# Patient Record
Sex: Female | Born: 1955 | Race: Black or African American | Hispanic: No | Marital: Single | Smoking: Former smoker
Health system: Southern US, Community
[De-identification: ages and names within clinical notes are randomized; demographics above are authoritative.]

## PROBLEM LIST (undated history)

## (undated) DIAGNOSIS — E785 Hyperlipidemia, unspecified: Secondary | ICD-10-CM

## (undated) DIAGNOSIS — I10 Essential (primary) hypertension: Secondary | ICD-10-CM

## (undated) DIAGNOSIS — F329 Major depressive disorder, single episode, unspecified: Secondary | ICD-10-CM

## (undated) DIAGNOSIS — F32A Depression, unspecified: Secondary | ICD-10-CM

## (undated) DIAGNOSIS — F419 Anxiety disorder, unspecified: Secondary | ICD-10-CM

## (undated) DIAGNOSIS — E119 Type 2 diabetes mellitus without complications: Secondary | ICD-10-CM

## (undated) HISTORY — PX: ABDOMINAL HYSTERECTOMY: SHX81

---

## 2003-12-22 ENCOUNTER — Ambulatory Visit (HOSPITAL_COMMUNITY): Admission: RE | Admit: 2003-12-22 | Discharge: 2003-12-22 | Payer: Self-pay | Admitting: Family Medicine

## 2004-06-13 ENCOUNTER — Inpatient Hospital Stay (HOSPITAL_COMMUNITY): Admission: RE | Admit: 2004-06-13 | Discharge: 2004-06-15 | Payer: Self-pay | Admitting: Obstetrics & Gynecology

## 2004-08-20 ENCOUNTER — Emergency Department (HOSPITAL_COMMUNITY): Admission: EM | Admit: 2004-08-20 | Discharge: 2004-08-20 | Payer: Self-pay | Admitting: *Deleted

## 2015-09-28 ENCOUNTER — Emergency Department (HOSPITAL_COMMUNITY)
Admission: EM | Admit: 2015-09-28 | Discharge: 2015-09-28 | Disposition: A | Discharge status: Home / Self Care | Payer: BLUE CROSS/BLUE SHIELD | Attending: Emergency Medicine | Admitting: Emergency Medicine

## 2015-09-28 ENCOUNTER — Emergency Department (HOSPITAL_COMMUNITY): Payer: BLUE CROSS/BLUE SHIELD

## 2015-09-28 ENCOUNTER — Encounter (HOSPITAL_COMMUNITY): Payer: Self-pay | Admitting: Emergency Medicine

## 2015-09-28 DIAGNOSIS — Z79899 Other long term (current) drug therapy: Secondary | ICD-10-CM | POA: Insufficient documentation

## 2015-09-28 DIAGNOSIS — R42 Dizziness and giddiness: Secondary | ICD-10-CM | POA: Diagnosis not present

## 2015-09-28 DIAGNOSIS — R202 Paresthesia of skin: Secondary | ICD-10-CM | POA: Insufficient documentation

## 2015-09-28 DIAGNOSIS — Z5181 Encounter for therapeutic drug level monitoring: Secondary | ICD-10-CM | POA: Diagnosis not present

## 2015-09-28 DIAGNOSIS — I10 Essential (primary) hypertension: Secondary | ICD-10-CM | POA: Diagnosis not present

## 2015-09-28 DIAGNOSIS — F17210 Nicotine dependence, cigarettes, uncomplicated: Secondary | ICD-10-CM | POA: Diagnosis not present

## 2015-09-28 DIAGNOSIS — R200 Anesthesia of skin: Secondary | ICD-10-CM | POA: Diagnosis present

## 2015-09-28 HISTORY — DX: Essential (primary) hypertension: I10

## 2015-09-28 HISTORY — DX: Hyperlipidemia, unspecified: E78.5

## 2015-09-28 LAB — COMPREHENSIVE METABOLIC PANEL
ALT: 32 U/L (ref 14–54)
AST: 29 U/L (ref 15–41)
Albumin: 4.3 g/dL (ref 3.5–5.0)
Alkaline Phosphatase: 79 U/L (ref 38–126)
Anion gap: 6 (ref 5–15)
BUN: 32 mg/dL — ABNORMAL HIGH (ref 6–20)
CO2: 24 mmol/L (ref 22–32)
Calcium: 9 mg/dL (ref 8.9–10.3)
Chloride: 102 mmol/L (ref 101–111)
Creatinine, Ser: 0.96 mg/dL (ref 0.44–1.00)
GFR calc Af Amer: >60 mL/min (ref >60)
GFR calc non Af Amer: >60 mL/min (ref >60)
Glucose, Bld: 100 mg/dL — ABNORMAL HIGH (ref 65–99)
Potassium: 3.4 mmol/L — ABNORMAL LOW (ref 3.5–5.1)
Sodium: 132 mmol/L — ABNORMAL LOW (ref 135–145)
Total Bilirubin: 0.3 mg/dL (ref 0.3–1.2)
Total Protein: 8.5 g/dL — ABNORMAL HIGH (ref 6.5–8.1)

## 2015-09-28 LAB — URINALYSIS, ROUTINE W REFLEX MICROSCOPIC
Bilirubin Urine: NEGATIVE
Glucose, UA: NEGATIVE mg/dL
Ketones, ur: NEGATIVE mg/dL
Leukocytes, UA: NEGATIVE
Nitrite: NEGATIVE
Protein, ur: NEGATIVE mg/dL
Specific Gravity, Urine: 1.025 (ref 1.005–1.030)
pH: 5.5 (ref 5.0–8.0)

## 2015-09-28 LAB — DIFFERENTIAL
Basophils Absolute: 0 10*3/uL (ref 0.0–0.1)
Basophils Relative: 1 %
Eosinophils Absolute: 0.1 10*3/uL (ref 0.0–0.7)
Eosinophils Relative: 2 %
Lymphocytes Relative: 38 %
Lymphs Abs: 2.2 10*3/uL (ref 0.7–4.0)
Monocytes Absolute: 0.5 10*3/uL (ref 0.1–1.0)
Monocytes Relative: 9 %
Neutro Abs: 2.9 10*3/uL (ref 1.7–7.7)
Neutrophils Relative %: 50 %

## 2015-09-28 LAB — TROPONIN I: Troponin I: <0.03 ng/mL (ref <0.03)

## 2015-09-28 LAB — CBC
HCT: 39.9 % (ref 36.0–46.0)
Hemoglobin: 12.8 g/dL (ref 12.0–15.0)
MCH: 26.8 pg (ref 26.0–34.0)
MCHC: 32.1 g/dL (ref 30.0–36.0)
MCV: 83.6 fL (ref 78.0–100.0)
Platelets: 243 10*3/uL (ref 150–400)
RBC: 4.77 MIL/uL (ref 3.87–5.11)
RDW: 14.6 % (ref 11.5–15.5)
WBC: 5.7 10*3/uL (ref 4.0–10.5)

## 2015-09-28 LAB — URINE MICROSCOPIC-ADD ON

## 2015-09-28 LAB — RAPID URINE DRUG SCREEN, HOSP PERFORMED
Amphetamines: NOT DETECTED
Barbiturates: NOT DETECTED
Benzodiazepines: NOT DETECTED
Cocaine: NOT DETECTED
Opiates: NOT DETECTED
Tetrahydrocannabinol: NOT DETECTED

## 2015-09-28 LAB — PROTIME-INR
INR: 0.96
Prothrombin Time: 12.8 seconds (ref 11.4–15.2)

## 2015-09-28 LAB — APTT: aPTT: 40 seconds — ABNORMAL HIGH (ref 24–36)

## 2015-09-28 LAB — ETHANOL: Alcohol, Ethyl (B): <5 mg/dL (ref <5)

## 2015-09-28 MED ORDER — POTASSIUM CHLORIDE CRYS ER 20 MEQ PO TBCR
20.0000 meq | EXTENDED_RELEASE_TABLET | Freq: Once | ORAL | Status: AC
  Administered 2015-09-28: 20 meq via ORAL
  Filled 2015-09-28: qty 1

## 2015-09-28 MED ORDER — LORAZEPAM 2 MG/ML IJ SOLN
INTRAMUSCULAR | Status: AC
  Filled 2015-09-28: qty 1

## 2015-09-28 MED ORDER — LORAZEPAM 2 MG/ML IJ SOLN
1.0000 mg | Freq: Once | INTRAMUSCULAR | Status: AC
Start: 2015-09-28 — End: 2015-09-28
  Administered 2015-09-28: 1 mg via INTRAVENOUS

## 2015-09-28 NOTE — ED Notes (Signed)
Patient verbalizes understanding of discharge instructions, home care and follow up care. Patient out of department at this time. 

## 2015-09-28 NOTE — ED Triage Notes (Signed)
Pt reports on-going dizziness, LT hand numbness for an unknown amount of time, but states it has been over a week. Pt poor historian. Pt noted to have a LT sided facial droop. When asked if this was normal, pt stated that "I don't know, I think so." AOx4. No slurred speech. Grips equal and strong.

## 2015-09-28 NOTE — ED Notes (Signed)
Patient ambulatory to and from restroom.

## 2015-09-28 NOTE — ED Notes (Signed)
MRI made doctor aware that pt had increased anxiousness when entering MRI. Pt given ativan.

## 2015-09-28 NOTE — ED Notes (Signed)
Pt being transported to MRI.

## 2015-09-28 NOTE — Discharge Instructions (Signed)
Take your usual prescriptions as previously directed.  Call your regular medical doctor and the Neurologist tomorrow to schedule a follow up appointment within the next 3 days.  Return to the Emergency Department immediately sooner if worsening.

## 2015-09-28 NOTE — ED Provider Notes (Signed)
[facility] DEPT Provider Note   CSN: [id number] Arrival date & time: 09/28/15  1800  First Provider Contact:  None       History   Chief Complaint Chief Complaint  Patient presents with  . Dizziness  . Numbness    HPI TAIA BACH is a 60 y.o. female.   Dizziness  Pt was seen at 1830. Per pt, c/o gradual onset and persistence of constant left hand "numbness" for "at least over a week or two." Cannot explain further where in her left hand the numbness is located. Pt also c/o "dizziness" for the same amount of time. Pt unclear regarding what she means by "dizziness," other than "when I sit down I'm fine then I stand up and I get dizzy." Denies CP/palpitations, no SOB/cough, no abd pain, no N/V/D, no focal motor weakness, no neck or back pain, no headache, no visual changes, no slurred speech, no dysphagia, no ataxia.     Past Medical History:  Diagnosis Date  . Hyperlipemia   . Hypertension     There are no active problems to display for this patient.   Past Surgical History:  Procedure Laterality Date  . ABDOMINAL HYSTERECTOMY        Home Medications    Prior to Admission medications   Not on File    Family History   Social History Social History  Substance Use Topics  . Smoking status: Current Every Day Smoker    Packs/day: 1.00    Types: Cigarettes  . Smokeless tobacco: Never Used  . Alcohol use No     Allergies   Review of patient's allergies indicates no known allergies.   Review of Systems Review of Systems  Neurological: Positive for dizziness.  ROS: Statement: All systems negative except as marked or noted in the HPI; Constitutional: Negative for fever and chills. ; ; Eyes: Negative for eye pain, redness and discharge. ; ; ENMT: Negative for ear pain, hoarseness, nasal congestion, sinus pressure and sore throat. ; ; Cardiovascular: Negative for chest pain, palpitations, diaphoresis, dyspnea and peripheral edema. ; ; Respiratory:  Negative for cough, wheezing and stridor. ; ; Gastrointestinal: Negative for nausea, vomiting, diarrhea, abdominal pain, blood in stool, hematemesis, jaundice and rectal bleeding. . ; ; Genitourinary: Negative for dysuria, flank pain and hematuria. ; ; Musculoskeletal: Negative for back pain and neck pain. Negative for swelling and trauma.; ; Skin: Negative for pruritus, rash, abrasions, blisters, bruising and skin lesion.; ; Neuro: +"numbness," "dizziness." Negative for headache, lightheadedness and neck stiffness. Negative for weakness, altered level of consciousness, altered mental status, extremity weakness, involuntary movement, seizure and syncope.      Physical Exam Updated Vital Signs BP 137/85 (BP Location: Left Arm)   Pulse 77   Temp 97.8 F (36.6 C) (Oral)   Resp 14   Ht 5\' 1"  (1.549 m)   Wt 139 lb (63 kg)   SpO2 100%   BMI 26.26 kg/m     20:32 Orthostatic Vital Signs TD  Orthostatic Lying   BP- Lying: 133/82  Pulse- Lying: 72      Orthostatic Sitting  BP- Sitting: 135/87  Pulse- Sitting: 75      Orthostatic Standing at 0 minutes  BP- Standing at 0 minutes:  138/93  Pulse- Standing at 0 minutes: 84    Physical Exam 1835: Physical examination:  Nursing notes reviewed; Vital signs and O2 SAT reviewed;  Constitutional: Well developed, Well nourished, Well hydrated, In no acute distress; Head:  Normocephalic, atraumatic;  Eyes: EOMI, PERRL, No scleral icterus; ENMT: Mouth and pharynx normal, Mucous membranes moist; Neck: Supple, Full range of motion, No lymphadenopathy; Cardiovascular: Regular rate and rhythm, No gallop; Respiratory: Breath sounds clear & equal bilaterally, No wheezes.  Speaking full sentences with ease, Normal respiratory effort/excursion; Chest: Nontender, Movement normal; Abdomen: Soft, Nontender, Nondistended, Normal bowel sounds; Genitourinary: No CVA tenderness; Extremities: Pulses normal, No tenderness, No edema, No calf edema or asymmetry. Left  shoulder w/FROM.  NT to palp entire joint, AC joint, clavicle NT, scapula NT, proximal humerus NT, biceps tendon NT over bicipital groove.  Motor strength at shoulder normal.  Sensation intact over deltoid region, distal NMS intact with hand on left side having intact and equal sensation and strength in the distribution of the median, radial, and ulnar nerve function compared to opposite side.  Strong radial pulse.  +FROM left elbow with intact motor strength biceps and triceps muscles to resistance.;; Spine:  No midline CS, TS, LS tenderness.;; Neuro: AA&Ox3, vague historian. Major CN grossly intact. Speech clear.  +left lower face drawn to the side.  No nystagmus. Grips equal. Strength 5/5 equal bilat UE's and LE's.  DTR 2/4 equal bilat UE's and LE's.  No gross sensory deficits.  Normal cerebellar testing bilat UE's (finger-nose) and LE's (heel-shin)..; Skin: Color normal, Warm, Dry.   ED Treatments / Results  Labs (all labs ordered are listed, but only abnormal results are displayed)   EKG  EKG Interpretation  Date/Time:  Thursday September 28 2015 18:10:25 EDT Ventricular Rate:  76 PR Interval:    QRS Duration: 84 QT Interval:  393 QTC Calculation: 442 R Axis:   21 Text Interpretation:  Sinus rhythm Abnormal R-wave progression, early transition Baseline wander No old tracing to compare Confirmed by [facility]  MD, Nunzio Cory [phone]) on 09/28/2015 7:18:21 PM       Radiology   Procedures Procedures (including critical care time)  Medications Ordered in ED Medications  LORazepam (ATIVAN) injection 1 mg (1 mg Intravenous Given 09/28/15 1854)     Initial Impression / Assessment and Plan / ED Course  I have reviewed the triage vital signs and the nursing notes.  Pertinent labs & imaging results that were available during my care of the patient were reviewed by me and considered in my medical decision making (see chart for details).  MDM Reviewed: previous chart, nursing note and  vitals Interpretation: labs, ECG and MRI   Results for orders placed or performed during the hospital encounter of 09/28/15  Ethanol  Result Value Ref Range   Alcohol, Ethyl (B) <5 <5 mg/dL  Protime-INR  Result Value Ref Range   Prothrombin Time 12.8 11.4 - 15.2 seconds   INR 0.96   APTT  Result Value Ref Range   aPTT 40 (H) 24 - 36 seconds  CBC  Result Value Ref Range   WBC 5.7 4.0 - 10.5 K/uL   RBC 4.77 3.87 - 5.11 MIL/uL   Hemoglobin 12.8 12.0 - 15.0 g/dL   HCT 39.9 36.0 - 46.0 %   MCV 83.6 78.0 - 100.0 fL   MCH 26.8 26.0 - 34.0 pg   MCHC 32.1 30.0 - 36.0 g/dL   RDW 14.6 11.5 - 15.5 %   Platelets 243 150 - 400 K/uL  Differential  Result Value Ref Range   Neutrophils Relative % 50 %   Neutro Abs 2.9 1.7 - 7.7 K/uL   Lymphocytes Relative 38 %   Lymphs Abs 2.2 0.7 - 4.0 K/uL   Monocytes  Relative 9 %   Monocytes Absolute 0.5 0.1 - 1.0 K/uL   Eosinophils Relative 2 %   Eosinophils Absolute 0.1 0.0 - 0.7 K/uL   Basophils Relative 1 %   Basophils Absolute 0.0 0.0 - 0.1 K/uL  Comprehensive metabolic panel  Result Value Ref Range   Sodium 132 (L) 135 - 145 mmol/L   Potassium 3.4 (L) 3.5 - 5.1 mmol/L   Chloride 102 101 - 111 mmol/L   CO2 24 22 - 32 mmol/L   Glucose, Bld 100 (H) 65 - 99 mg/dL   BUN 32 (H) 6 - 20 mg/dL   Creatinine, Ser 0.96 0.44 - 1.00 mg/dL   Calcium 9.0 8.9 - 10.3 mg/dL   Total Protein 8.5 (H) 6.5 - 8.1 g/dL   Albumin 4.3 3.5 - 5.0 g/dL   AST 29 15 - 41 U/L   ALT 32 14 - 54 U/L   Alkaline Phosphatase 79 38 - 126 U/L   Total Bilirubin 0.3 0.3 - 1.2 mg/dL   GFR calc non Af Amer >60 >60 mL/min   GFR calc Af Amer >60 >60 mL/min   Anion gap 6 5 - 15  Urine rapid drug screen (hosp performed)not at [facility]  Result Value Ref Range   Opiates NONE DETECTED NONE DETECTED   Cocaine NONE DETECTED NONE DETECTED   Benzodiazepines NONE DETECTED NONE DETECTED   Amphetamines NONE DETECTED NONE DETECTED   Tetrahydrocannabinol NONE DETECTED NONE DETECTED    Barbiturates NONE DETECTED NONE DETECTED  Urinalysis, Routine w reflex microscopic (not at [facility])  Result Value Ref Range   Color, Urine YELLOW YELLOW   APPearance CLEAR CLEAR   Specific Gravity, Urine 1.025 1.005 - 1.030   pH 5.5 5.0 - 8.0   Glucose, UA NEGATIVE NEGATIVE mg/dL   Hgb urine dipstick SMALL (A) NEGATIVE   Bilirubin Urine NEGATIVE NEGATIVE   Ketones, ur NEGATIVE NEGATIVE mg/dL   Protein, ur NEGATIVE NEGATIVE mg/dL   Nitrite NEGATIVE NEGATIVE   Leukocytes, UA NEGATIVE NEGATIVE  Troponin I  Result Value Ref Range   Troponin I <0.03 <0.03 ng/mL  Urine microscopic-add on  Result Value Ref Range   Squamous Epithelial / LPF 0-5 (A) NONE SEEN   WBC, UA 0-5 0 - 5 WBC/hpf   RBC / HPF 0-5 0 - 5 RBC/hpf   Bacteria, UA RARE (A) NONE SEEN   Dg Chest 2 View Result Date: 09/28/2015 CLINICAL DATA:  Ongoing dizziness and left arm numbness. EXAM: CHEST  2 VIEW COMPARISON:  None. FINDINGS: Cardiomediastinal silhouette is normal. Mediastinal contours appear intact. There is no evidence of focal airspace consolidation, pleural effusion or pneumothorax. Osseous structures are without acute abnormality. 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 Soft tissues are grossly normal. IMPRESSION: No active cardiopulmonary disease. Electronically Signed   By: Fidela Salisbury M.D.   On: 09/28/2015 20:28   Ct Cervical Spine Wo Contrast Result Date: 09/28/2015 CLINICAL DATA:  Left hand numbness an ongoing dizziness. EXAM: CT CERVICAL SPINE WITHOUT CONTRAST TECHNIQUE: Multidetector CT imaging of the cervical spine was performed without intravenous contrast. Multiplanar CT image reconstructions were also generated. COMPARISON:  None. FINDINGS: There is normal alignment of the cervical spine. There is no evidence for acute fracture or dislocation. There are multilevel osteoarthritic changes of the cervical spine with disc space narrowing, endplate sclerosis and osteophyte  formation. Mild to moderate posterior facet arthropathy is also noted. There is a resultant mild bony neural foramina narrowing of left C3-C4, bilateral  C4-C5, left C5-C6 and left C6-C7. Prevertebral soft tissues have a normal appearance. Lung apices have a normal appearance. IMPRESSION: No evidence of fracture or alignment abnormality of the cervical spine. Multilevel osteoarthritic changes of the cervical spine causing multilevel predominantly left-sided bony neural foramina narrowing. Electronically Signed   By: Fidela Salisbury M.D.   On: 09/28/2015 20:41   Mr Brain Wo Contrast (neuro Protocol) Result Date: 09/28/2015 CLINICAL DATA:  60 year old hypertensive female with hyperlipidemia with altered mental status and left-sided weakness for the past day. Initial encounter. EXAM: MRI HEAD WITHOUT CONTRAST TECHNIQUE: Multiplanar, multiecho pulse sequences of the brain and surrounding structures were obtained without intravenous contrast. COMPARISON:  None. FINDINGS: Exam is slightly motion degraded. No acute infarct or intracranial hemorrhage. Moderate to marked white matter changes which in the present clinical setting is most consistent with result of chronic microvascular disease. Other causes of white matter changes including that secondary to demyelinating process, inflammatory process or vasculitis are felt to be secondary less likely considerations. Mild atrophy without hydrocephalus. No intracranial mass lesion noted on this unenhanced exam. Major intracranial vascular structures are patent. No acute orbital abnormality. Cervical medullary junction within normal limits. Shallow sella with small pituitary gland probably an incidental finding. IMPRESSION: Exam is slightly motion degraded. No acute infarct or intracranial hemorrhage. Moderate to marked white matter changes which in the present clinical setting is most consistent with result of chronic microvascular disease. Other causes felt less likely.  No intracranial mass lesion noted on this unenhanced exam. Electronically Signed   By: Genia Del M.D.   On: 09/28/2015 19:37     1835:  Pt is very vague historian. Cannot further explain her symptoms nor when they began, only can state "they've been there a while." Pt asked regarding the left lower side of her face drawing and she said "I don't know." No other objective sensory or motor deficits on exam.  Family members in the exam room also unable to contribute further to HPI. Workup ordered. Will continue to monitor.   1855:  Pt given ativan before MRI for c/o anxiousness.   2120:  Pt not orthostatic on VS. Pt has ambulated with steady gait. Pt has tol PO well without N/V. Potassium repleted PO. Workup otherwise reassuring. No clear indication for admission at this time. Pt states she wants to go home now. Dx and testing d/w pt and family.  Questions answered.  Verb understanding, agreeable to d/c home with outpt f/u.     Final Clinical Impressions(s) / ED Diagnoses   Final diagnoses:  None    New Prescriptions New Prescriptions   No medications on file      Francine Graven, DO 10/01/15 1417

## 2015-09-30 ENCOUNTER — Encounter (HOSPITAL_COMMUNITY): Payer: Self-pay | Admitting: *Deleted

## 2015-09-30 ENCOUNTER — Emergency Department (HOSPITAL_COMMUNITY)
Admission: EM | Admit: 2015-09-30 | Discharge: 2015-09-30 | Disposition: A | Discharge status: Home / Self Care | Payer: BLUE CROSS/BLUE SHIELD | Attending: Emergency Medicine | Admitting: Emergency Medicine

## 2015-09-30 DIAGNOSIS — I10 Essential (primary) hypertension: Secondary | ICD-10-CM | POA: Insufficient documentation

## 2015-09-30 DIAGNOSIS — F17210 Nicotine dependence, cigarettes, uncomplicated: Secondary | ICD-10-CM | POA: Insufficient documentation

## 2015-09-30 DIAGNOSIS — Z79899 Other long term (current) drug therapy: Secondary | ICD-10-CM | POA: Insufficient documentation

## 2015-09-30 DIAGNOSIS — R42 Dizziness and giddiness: Secondary | ICD-10-CM | POA: Diagnosis present

## 2015-09-30 DIAGNOSIS — R51 Headache: Secondary | ICD-10-CM | POA: Insufficient documentation

## 2015-09-30 DIAGNOSIS — R519 Headache, unspecified: Secondary | ICD-10-CM

## 2015-09-30 MED ORDER — METOCLOPRAMIDE HCL 5 MG/ML IJ SOLN
10.0000 mg | Freq: Once | INTRAMUSCULAR | Status: AC
  Administered 2015-09-30: 10 mg via INTRAMUSCULAR
  Filled 2015-09-30: qty 2

## 2015-09-30 MED ORDER — KETOROLAC TROMETHAMINE 60 MG/2ML IM SOLN
60.0000 mg | Freq: Once | INTRAMUSCULAR | Status: AC
  Administered 2015-09-30: 60 mg via INTRAMUSCULAR
  Filled 2015-09-30: qty 2

## 2015-09-30 MED ORDER — IBUPROFEN 800 MG PO TABS: 800.0000 mg | ORAL_TABLET | Freq: Three times a day (TID) | ORAL | 0 refills | Status: DC

## 2015-09-30 MED ORDER — DIPHENHYDRAMINE HCL 25 MG PO CAPS
25.0000 mg | ORAL_CAPSULE | Freq: Once | ORAL | Status: AC
  Administered 2015-09-30: 25 mg via ORAL
  Filled 2015-09-30: qty 1

## 2015-09-30 MED ORDER — BUTALBITAL-APAP-CAFFEINE 50-325-40 MG PO TABS: 1.0000 | ORAL_TABLET | Freq: Four times a day (QID) | ORAL | 0 refills | Status: DC | PRN

## 2015-09-30 NOTE — ED Triage Notes (Signed)
Pt c/o intermittent dizziness and headache that started several days ago, was seen in er on 09/28/2015 for same, pt reports that she is not any better,

## 2015-09-30 NOTE — ED Provider Notes (Addendum)
[facility] DEPT Provider Note   CSN: [id number] Arrival date & time: 09/30/15  [id number]  First Provider Contact:  First MD Initiated Contact with Patient 09/30/15 1924        History   Chief Complaint Chief Complaint  Patient presents with  . Dizziness    HPI Teresa Sweeney is a 60 y.o. female.  HPI   The patient is a 60 year old female, she reports a history of hypertension and hyperlipidemia, she has had a vague history over the last couple of weeks of some tingling in the left hand associated with headaches, the headaches come and go, she reports that they are frontal, throbbing, intermittent, she has no photophobia vomiting fevers or stiff neck. Today the headaches have been particular severe, they have gone away several times and she was totally pain-free today as well. She does not identify any inciting or alleviating factors, she has taken [facility] powders prior to arrival and states that they do not help  Past Medical History:  Diagnosis Date  . Hyperlipemia   . Hypertension     There are no active problems to display for this patient.   Past Surgical History:  Procedure Laterality Date  . ABDOMINAL HYSTERECTOMY      OB History    No data available       Home Medications    Prior to Admission medications   Medication Sig Start Date End Date Taking? Authorizing Provider  pravastatin (PRAVACHOL) 20 MG tablet Take 20 mg by mouth at bedtime. 09/22/15  Yes Historical Provider, MD  triamterene-hydrochlorothiazide (DYAZIDE) 37.5-25 MG capsule Take 1 capsule by mouth daily. 09/22/15  Yes Historical Provider, MD  butalbital-acetaminophen-caffeine (FIORICET) 50-325-40 MG tablet Take 1-2 tablets by mouth every 6 (six) hours as needed for headache. 09/30/15 09/29/16  Noemi Chapel, MD  ibuprofen (ADVIL,MOTRIN) 800 MG tablet Take 1 tablet (800 mg total) by mouth 3 (three) times daily. 09/30/15   Noemi Chapel, MD    Family History No family history on file.  Social  History Social History  Substance Use Topics  . Smoking status: Current Every Day Smoker    Packs/day: 1.00    Types: Cigarettes  . Smokeless tobacco: Never Used  . Alcohol use No     Allergies   Review of patient's allergies indicates no known allergies.   Review of Systems Review of Systems  All other systems reviewed and are negative.    Physical Exam Updated Vital Signs BP 153/92 (BP Location: Right Arm)   Pulse 85   Temp 98.1 F (36.7 C) (Oral)   Resp 18   Ht 5\' 1"  (1.549 m)   Wt 138 lb (62.6 kg)   SpO2 100%   BMI 26.07 kg/m   Physical Exam  Constitutional: She appears well-developed and well-nourished. No distress.  HENT:  Head: Normocephalic and atraumatic.  Mouth/Throat: Oropharynx is clear and moist. No oropharyngeal exudate.  Clear tympanic membranes, clear external auditory canals  Eyes: Conjunctivae and EOM are normal. Pupils are equal, round, and reactive to light. Right eye exhibits no discharge. Left eye exhibits no discharge. No scleral icterus.  Neck: Normal range of motion. Neck supple. No JVD present. No thyromegaly present.  Cardiovascular: Normal rate, regular rhythm, normal heart sounds and intact distal pulses.  Exam reveals no gallop and no friction rub.   No murmur heard. Pulmonary/Chest: Effort normal and breath sounds normal. No respiratory distress. She has no wheezes. She has no rales.  Abdominal: Soft. Bowel sounds are normal. She  exhibits no distension and no mass. There is no tenderness.  Musculoskeletal: Normal range of motion. She exhibits no edema or tenderness.  Lymphadenopathy:    She has no cervical adenopathy.  Neurological: She is alert. Coordination normal.  Neurologic exam:  Speech clear, pupils equal round reactive to light, extraocular movements intact  Normal peripheral visual fields Cranial nerves III through XII normal including no facial droop Follows commands, moves all extremities x4, normal strength to bilateral  upper and lower extremities at all major muscle groups including grip Sensation normal to light touch and pinprick Coordination intact, no limb ataxia, finger-nose-finger normal Rapid alternating movements normal No pronator drift Gait normal  Skin: Skin is warm and dry. No rash noted. No erythema.  Psychiatric: She has a normal mood and affect. Her behavior is normal.  Nursing note and vitals reviewed.    ED Treatments / Results  Labs (all labs ordered are listed, but only abnormal results are displayed) Labs Reviewed - No data to display  EKG  EKG Interpretation None       Radiology No results found.  Procedures Procedures (including critical care time)  Medications Ordered in ED Medications  ketorolac (TORADOL) injection 60 mg (60 mg Intramuscular Given 09/30/15 1948)  metoCLOPramide (REGLAN) injection 10 mg (10 mg Intramuscular Given 09/30/15 1950)  diphenhydrAMINE (BENADRYL) capsule 25 mg (25 mg Oral Given 09/30/15 1947)     Initial Impression / Assessment and Plan / ED Course  I have reviewed the triage vital signs and the nursing notes.  Pertinent labs & imaging results that were available during my care of the patient were reviewed by me and considered in my medical decision making (see chart for details).  Clinical Course  Comment By Time  Improved to pain free with IM meds - feeling good and amenable to d/c - the pt's brother states that she stated that if she had a gun she would kill herself b/c of the pain.  The pt states that she would not do this and was only saying that metaphorically - she has no SI and no depression. Noemi Chapel, MD 08/05 2106   Over the last several days the patient has had a significant and very thorough workup for headaches and neurologic symptoms including an MRI of the brain and a CT scan of the neck, there was no acute findings, her lab work was diffusely normal and she has no other complaints other than headache which is now  resolved  Final Clinical Impressions(s) / ED Diagnoses   Final diagnoses:  Nonintractable headache, unspecified chronicity pattern, unspecified headache type    New Prescriptions New Prescriptions   BUTALBITAL-ACETAMINOPHEN-CAFFEINE (FIORICET) 50-325-40 MG TABLET    Take 1-2 tablets by mouth every 6 (six) hours as needed for headache.   IBUPROFEN (ADVIL,MOTRIN) 800 MG TABLET    Take 1 tablet (800 mg total) by mouth 3 (three) times daily.     Noemi Chapel, MD 09/30/15 [id number]    Noemi Chapel, MD 09/30/15 2109

## 2015-09-30 NOTE — Discharge Instructions (Signed)

## 2015-12-08 ENCOUNTER — Emergency Department (HOSPITAL_COMMUNITY)
Admission: EM | Admit: 2015-12-08 | Discharge: 2015-12-08 | Disposition: A | Discharge status: Home / Self Care | Payer: BLUE CROSS/BLUE SHIELD | Attending: Emergency Medicine | Admitting: Emergency Medicine

## 2015-12-08 ENCOUNTER — Encounter (HOSPITAL_COMMUNITY): Payer: Self-pay | Admitting: Emergency Medicine

## 2015-12-08 ENCOUNTER — Other Ambulatory Visit: Payer: Self-pay

## 2015-12-08 ENCOUNTER — Emergency Department (HOSPITAL_COMMUNITY): Payer: BLUE CROSS/BLUE SHIELD

## 2015-12-08 DIAGNOSIS — R0789 Other chest pain: Secondary | ICD-10-CM | POA: Insufficient documentation

## 2015-12-08 DIAGNOSIS — Z87891 Personal history of nicotine dependence: Secondary | ICD-10-CM | POA: Diagnosis not present

## 2015-12-08 DIAGNOSIS — Z791 Long term (current) use of non-steroidal anti-inflammatories (NSAID): Secondary | ICD-10-CM | POA: Diagnosis not present

## 2015-12-08 DIAGNOSIS — I10 Essential (primary) hypertension: Secondary | ICD-10-CM | POA: Insufficient documentation

## 2015-12-08 DIAGNOSIS — R079 Chest pain, unspecified: Secondary | ICD-10-CM

## 2015-12-08 LAB — BASIC METABOLIC PANEL
Anion gap: 7 (ref 5–15)
BUN: 33 mg/dL — AB (ref 6–20)
CHLORIDE: 103 mmol/L (ref 101–111)
CO2: 28 mmol/L (ref 22–32)
CREATININE: 1.44 mg/dL — AB (ref 0.44–1.00)
Calcium: 9.1 mg/dL (ref 8.9–10.3)
GFR calc Af Amer: 44 mL/min — ABNORMAL LOW (ref >60)
GFR calc non Af Amer: 38 mL/min — ABNORMAL LOW (ref >60)
GLUCOSE: 96 mg/dL (ref 65–99)
POTASSIUM: 3.6 mmol/L (ref 3.5–5.1)
Sodium: 138 mmol/L (ref 135–145)

## 2015-12-08 LAB — CBC
HEMATOCRIT: 39.2 % (ref 36.0–46.0)
Hemoglobin: 12.5 g/dL (ref 12.0–15.0)
MCH: 27.4 pg (ref 26.0–34.0)
MCHC: 31.9 g/dL (ref 30.0–36.0)
MCV: 85.8 fL (ref 78.0–100.0)
PLATELETS: 226 10*3/uL (ref 150–400)
RBC: 4.57 MIL/uL (ref 3.87–5.11)
RDW: 14.6 % (ref 11.5–15.5)
WBC: 4.4 10*3/uL (ref 4.0–10.5)

## 2015-12-08 LAB — I-STAT TROPONIN, ED: Troponin i, poc: 0 ng/mL (ref 0.00–0.08)

## 2015-12-08 NOTE — ED Provider Notes (Signed)
[facility] DEPT Provider Note   CSN: [id number] Arrival date & time: 12/08/15  1654     History   Chief Complaint Chief Complaint  Patient presents with  . Chest Pain    HPI Teresa Sweeney is a 60 y.o. female. Here for evaluation of chest discomfort present for several days, evaluated yesterday by her PCP, and 2 weeks ago at another hospital. The pain is worse with deep breathing, movement and coughing. No productive cough. No fever, chills, nausea, vomiting, weakness or dizziness. Her PCP prescribed "a muscle relaxer" yesterday, which she has started take but not had improvement, yet. She denies headache, neck pain or back pain. Her job requires heavy lifting. There are no other known modifying factors.  HPI  Past Medical History:  Diagnosis Date  . Hyperlipemia   . Hypertension     There are no active problems to display for this patient.   Past Surgical History:  Procedure Laterality Date  . ABDOMINAL HYSTERECTOMY      OB History    No data available       Home Medications    Prior to Admission medications   Medication Sig Start Date End Date Taking? Authorizing Provider  butalbital-acetaminophen-caffeine (FIORICET) 50-325-40 MG tablet Take 1-2 tablets by mouth every 6 (six) hours as needed for headache. 09/30/15 09/29/16  Noemi Chapel, MD  ibuprofen (ADVIL,MOTRIN) 800 MG tablet Take 1 tablet (800 mg total) by mouth 3 (three) times daily. 09/30/15   Noemi Chapel, MD  pravastatin (PRAVACHOL) 20 MG tablet Take 20 mg by mouth at bedtime. 09/22/15   Historical Provider, MD  triamterene-hydrochlorothiazide (DYAZIDE) 37.5-25 MG capsule Take 1 capsule by mouth daily. 09/22/15   Historical Provider, MD    Family History Family History  Problem Relation Age of Onset  . Heart attack Mother   . Diabetes Sister   . Diabetes Other   . Hypertension Other   . Cancer Other     Social History Social History  Substance Use Topics  . Smoking status: Former Smoker   Packs/day: 1.00    Types: Cigarettes  . Smokeless tobacco: Never Used  . Alcohol use No     Allergies   Review of patient's allergies indicates no known allergies.   Review of Systems Review of Systems  All other systems reviewed and are negative.    Physical Exam Updated Vital Signs BP 127/82 (BP Location: Left Arm)   Pulse 73   Temp 98.4 F (36.9 C) (Oral)   Resp 14   Ht 5\' 1"  (1.549 m)   Wt 144 lb (65.3 kg)   SpO2 100%   BMI 27.21 kg/m   Physical Exam  Constitutional: She is oriented to person, place, and time. She appears well-developed and well-nourished.  HENT:  Head: Normocephalic and atraumatic.  Eyes: Conjunctivae and EOM are normal. Pupils are equal, round, and reactive to light.  Neck: Normal range of motion and phonation normal. Neck supple.  Cardiovascular: Normal rate and regular rhythm.   Pulmonary/Chest: Effort normal and breath sounds normal. No respiratory distress. She has no wheezes. She exhibits no tenderness.  Abdominal: Soft. She exhibits no distension. There is no tenderness. There is no guarding.  Musculoskeletal: Normal range of motion.  Neurological: She is alert and oriented to person, place, and time. She exhibits normal muscle tone.  Skin: Skin is warm and dry.  Psychiatric: Her behavior is normal. Judgment and thought content normal.  Anxious  Nursing note and vitals reviewed.  ED Treatments / Results  Labs (all labs ordered are listed, but only abnormal results are displayed) Labs Reviewed  BASIC METABOLIC PANEL - Abnormal; Notable for the following:       Result Value   BUN 33 (*)    Creatinine, Ser 1.44 (*)    GFR calc non Af Amer 38 (*)    GFR calc Af Amer 44 (*)    All other components within normal limits  CBC  I-STAT TROPOININ, ED    EKG  EKG Interpretation  Date/Time:  Friday December 08 2015 16:58:59 EDT Ventricular Rate:  78 PR Interval:  122 QRS Duration: 74 QT Interval:  372 QTC Calculation: 424 R  Axis:   -12 Text Interpretation:  Normal sinus rhythm Minimal voltage criteria for LVH, may be normal variant Borderline ECG since last tracing no significant change Confirmed by Eulis Foster  MD, Maurice Ramseur [phone]) on 12/08/2015 7:48:11 PM       Radiology Dg Chest 2 View  Result Date: 12/08/2015 CLINICAL DATA:  Chest pain EXAM: CHEST  2 VIEW COMPARISON:  September 28, 2015 FINDINGS: There is no edema or consolidation. The heart size and pulmonary vascularity normal. No adenopathy. No pneumothorax. There is mild degenerative change in the thoracic spine. IMPRESSION: No edema or consolidation. Electronically Signed   By: Lowella Grip III M.D.   On: 12/08/2015 17:17    Procedures Procedures (including critical care time)  Medications Ordered in ED Medications - No data to display   Initial Impression / Assessment and Plan / ED Course  I have reviewed the triage vital signs and the nursing notes.  Pertinent labs & imaging results that were available during my care of the patient were reviewed by me and considered in my medical decision making (see chart for details).  Clinical Course  Value Comment By Time  Creatinine: (!) 1.44 High  Daleen Bo, MD 10/13 1935  ED EKG within 10 minutes (Reviewed) Daleen Bo, MD 10/13 1936    Medications - No data to display  Patient Vitals for the past 24 hrs:  BP Temp Temp src Pulse Resp SpO2 Height Weight  12/08/15 1932 127/82 - - 73 14 100 % - -  12/08/15 1703 - - - - - - 5\' 1"  (1.549 m) 144 lb (65.3 kg)  12/08/15 1702 137/80 98.4 F (36.9 C) Oral 81 16 100 % - -    7:48 PM Reevaluation with update and discussion. After initial assessment and treatment, an updated evaluation reveals no change in clinical status. Findings discussed with patient, and family members, all questions answered. Cylas Falzone L    Final Clinical Impressions(s) / ED Diagnoses   Final diagnoses:  Nonspecific chest pain    Chest wall pain with reassuring, past  history, and low risk patient for cardiac disease. Doubt pneumonia, ACS, PE, serious bacterial infection or metabolic instability.  Nursing Notes Reviewed/ Care Coordinated Applicable Imaging Reviewed Interpretation of Laboratory Data incorporated into ED treatment  The patient appears reasonably screened and/or stabilized for discharge and I doubt any other medical condition or other [facility] requiring further screening, evaluation, or treatment in the ED at this time prior to discharge.  Plan: Home Medications- continue; Home Treatments- rest, light duty for 1 week; return here if the recommended treatment, does not improve the symptoms; Recommended follow up- PCP check up next week   New Prescriptions New Prescriptions   No medications on file     Daleen Bo, MD 12/08/15 1951

## 2015-12-08 NOTE — Discharge Instructions (Signed)
Use heat on the sore area 3 or 4 times a day.  Use of medication prescribed by your doctor, to see if it helps.  Also use ibuprofen 400 mg 3 times a day with meals for pain

## 2015-12-08 NOTE — ED Triage Notes (Signed)
Pt brought in by EMS for chest pain. Pt states she had pain that knocked her to the floor while at work. Pt has been seen here twice and by her PCP for same. Pt seen yesterday by PCP. Pt was told she was having anxiety and muscle spasms. Per EMS report 12 lead was unremarkable en route. Pt was given unknown dosage of aspirin at work.

## 2015-12-08 NOTE — ED Notes (Signed)
Pt alert & oriented x4, stable gait. Patient given discharge instructions, paperwork & prescription(s). Registration completed in room.  Patient verbalized understanding. Pt left department w/ no further questions. 

## 2016-04-11 ENCOUNTER — Encounter: Payer: Self-pay | Admitting: Medical Oncology

## 2016-04-11 ENCOUNTER — Emergency Department
Admission: EM | Admit: 2016-04-11 | Discharge: 2016-04-11 | Disposition: A | Discharge status: Other Acute Inpatient Hospital | Payer: BLUE CROSS/BLUE SHIELD | Attending: Emergency Medicine | Admitting: Emergency Medicine

## 2016-04-11 ENCOUNTER — Inpatient Hospital Stay
Admission: EM | Admit: 2016-04-11 | Discharge: 2016-04-15 | DRG: 897 | Disposition: A | Discharge status: Home / Self Care | Payer: BLUE CROSS/BLUE SHIELD | Source: Intra-hospital | Attending: Psychiatry | Admitting: Psychiatry

## 2016-04-11 ENCOUNTER — Emergency Department: Payer: BLUE CROSS/BLUE SHIELD

## 2016-04-11 DIAGNOSIS — F22 Delusional disorders: Secondary | ICD-10-CM | POA: Diagnosis present

## 2016-04-11 DIAGNOSIS — G4700 Insomnia, unspecified: Secondary | ICD-10-CM | POA: Diagnosis present

## 2016-04-11 DIAGNOSIS — Z5181 Encounter for therapeutic drug level monitoring: Secondary | ICD-10-CM | POA: Diagnosis not present

## 2016-04-11 DIAGNOSIS — I10 Essential (primary) hypertension: Secondary | ICD-10-CM | POA: Diagnosis present

## 2016-04-11 DIAGNOSIS — Z87891 Personal history of nicotine dependence: Secondary | ICD-10-CM

## 2016-04-11 DIAGNOSIS — F1994 Other psychoactive substance use, unspecified with psychoactive substance-induced mood disorder: Principal | ICD-10-CM | POA: Diagnosis present

## 2016-04-11 DIAGNOSIS — F329 Major depressive disorder, single episode, unspecified: Secondary | ICD-10-CM | POA: Insufficient documentation

## 2016-04-11 DIAGNOSIS — Z79899 Other long term (current) drug therapy: Secondary | ICD-10-CM | POA: Insufficient documentation

## 2016-04-11 DIAGNOSIS — R4182 Altered mental status, unspecified: Secondary | ICD-10-CM | POA: Insufficient documentation

## 2016-04-11 DIAGNOSIS — R45851 Suicidal ideations: Secondary | ICD-10-CM | POA: Diagnosis present

## 2016-04-11 DIAGNOSIS — F32A Depression, unspecified: Secondary | ICD-10-CM

## 2016-04-11 DIAGNOSIS — E785 Hyperlipidemia, unspecified: Secondary | ICD-10-CM | POA: Diagnosis present

## 2016-04-11 DIAGNOSIS — M79606 Pain in leg, unspecified: Secondary | ICD-10-CM | POA: Diagnosis present

## 2016-04-11 DIAGNOSIS — R739 Hyperglycemia, unspecified: Secondary | ICD-10-CM | POA: Diagnosis present

## 2016-04-11 DIAGNOSIS — E1140 Type 2 diabetes mellitus with diabetic neuropathy, unspecified: Secondary | ICD-10-CM | POA: Diagnosis present

## 2016-04-11 DIAGNOSIS — E119 Type 2 diabetes mellitus without complications: Secondary | ICD-10-CM

## 2016-04-11 LAB — URINE DRUG SCREEN, QUALITATIVE (ARMC ONLY)
AMPHETAMINES, UR SCREEN: NOT DETECTED
BARBITURATES, UR SCREEN: NOT DETECTED
Benzodiazepine, Ur Scrn: POSITIVE — AB
COCAINE METABOLITE, UR ~~LOC~~: NOT DETECTED
Cannabinoid 50 Ng, Ur ~~LOC~~: NOT DETECTED
MDMA (Ecstasy)Ur Screen: NOT DETECTED
METHADONE SCREEN, URINE: NOT DETECTED
Opiate, Ur Screen: NOT DETECTED
Phencyclidine (PCP) Ur S: NOT DETECTED
TRICYCLIC, UR SCREEN: NOT DETECTED

## 2016-04-11 LAB — COMPREHENSIVE METABOLIC PANEL
ALBUMIN: 4.4 g/dL (ref 3.5–5.0)
ALT: 75 U/L — ABNORMAL HIGH (ref 14–54)
ANION GAP: 10 (ref 5–15)
AST: 43 U/L — ABNORMAL HIGH (ref 15–41)
Alkaline Phosphatase: 106 U/L (ref 38–126)
BUN: 33 mg/dL — ABNORMAL HIGH (ref 6–20)
CALCIUM: 9.5 mg/dL (ref 8.9–10.3)
CHLORIDE: 103 mmol/L (ref 101–111)
CO2: 22 mmol/L (ref 22–32)
Creatinine, Ser: 1.06 mg/dL — ABNORMAL HIGH (ref 0.44–1.00)
GFR calc Af Amer: >60 mL/min (ref >60)
GFR calc non Af Amer: 55 mL/min — ABNORMAL LOW (ref >60)
Glucose, Bld: 205 mg/dL — ABNORMAL HIGH (ref 65–99)
POTASSIUM: 3.7 mmol/L (ref 3.5–5.1)
SODIUM: 135 mmol/L (ref 135–145)
Total Bilirubin: 0.3 mg/dL (ref 0.3–1.2)
Total Protein: 8.7 g/dL — ABNORMAL HIGH (ref 6.5–8.1)

## 2016-04-11 LAB — TSH: TSH: 2.938 u[IU]/mL (ref 0.350–4.500)

## 2016-04-11 LAB — CBC
HCT: 40 % (ref 35.0–47.0)
HEMOGLOBIN: 13.5 g/dL (ref 12.0–16.0)
MCH: 27.5 pg (ref 26.0–34.0)
MCHC: 33.6 g/dL (ref 32.0–36.0)
MCV: 81.9 fL (ref 80.0–100.0)
Platelets: 232 10*3/uL (ref 150–440)
RBC: 4.88 MIL/uL (ref 3.80–5.20)
RDW: 14.3 % (ref 11.5–14.5)
WBC: 6.4 10*3/uL (ref 3.6–11.0)

## 2016-04-11 LAB — ETHANOL: Alcohol, Ethyl (B): <5 mg/dL (ref <5)

## 2016-04-11 LAB — ACETAMINOPHEN LEVEL

## 2016-04-11 LAB — SALICYLATE LEVEL

## 2016-04-11 LAB — CK: Total CK: 138 U/L (ref 38–234)

## 2016-04-11 MED ORDER — TEMAZEPAM 7.5 MG PO CAPS: 7.5000 mg | ORAL_CAPSULE | Freq: Every evening | ORAL | Status: DC | PRN

## 2016-04-11 MED ORDER — SODIUM CHLORIDE 0.9 % IV BOLUS (SEPSIS)
1000.0000 mL | Freq: Once | INTRAVENOUS | Status: AC
  Administered 2016-04-11: 1000 mL via INTRAVENOUS

## 2016-04-11 MED ORDER — TEMAZEPAM 7.5 MG PO CAPS
7.5000 mg | ORAL_CAPSULE | Freq: Every evening | ORAL | Status: DC | PRN
  Administered 2016-04-11 – 2016-04-12 (×2): 7.5 mg via ORAL
  Filled 2016-04-11 (×4): qty 1

## 2016-04-11 MED ORDER — TRIAMTERENE-HCTZ 37.5-25 MG PO TABS
1.0000 | ORAL_TABLET | Freq: Every day | ORAL | Status: DC
  Administered 2016-04-11: 1 via ORAL
  Filled 2016-04-11: qty 1

## 2016-04-11 MED ORDER — ACETAMINOPHEN 325 MG PO TABS
650.0000 mg | ORAL_TABLET | Freq: Four times a day (QID) | ORAL | Status: DC | PRN
  Administered 2016-04-11: 650 mg via ORAL
  Filled 2016-04-11: qty 2

## 2016-04-11 MED ORDER — ALUM & MAG HYDROXIDE-SIMETH 200-200-20 MG/5ML PO SUSP: 30.0000 mL | ORAL | Status: DC | PRN

## 2016-04-11 MED ORDER — MAGNESIUM HYDROXIDE 400 MG/5ML PO SUSP: 30.0000 mL | Freq: Every day | ORAL | Status: DC | PRN

## 2016-04-11 MED ORDER — TRIAMTERENE-HCTZ 37.5-25 MG PO TABS
1.0000 | ORAL_TABLET | Freq: Every day | ORAL | Status: DC
  Administered 2016-04-12 – 2016-04-15 (×4): 1 via ORAL
  Filled 2016-04-11 (×4): qty 1

## 2016-04-11 MED ORDER — PRAVASTATIN SODIUM 20 MG PO TABS
20.0000 mg | ORAL_TABLET | Freq: Every day | ORAL | Status: DC
  Administered 2016-04-12 – 2016-04-14 (×3): 20 mg via ORAL
  Filled 2016-04-11 (×3): qty 1

## 2016-04-11 MED ORDER — PRAVASTATIN SODIUM 20 MG PO TABS
20.0000 mg | ORAL_TABLET | Freq: Every day | ORAL | Status: DC
  Administered 2016-04-11: 20 mg via ORAL
  Filled 2016-04-11: qty 1

## 2016-04-11 MED ORDER — LORAZEPAM 2 MG/ML IJ SOLN
2.0000 mg | Freq: Once | INTRAMUSCULAR | Status: AC
  Administered 2016-04-11: 2 mg via INTRAVENOUS
  Filled 2016-04-11: qty 1

## 2016-04-11 NOTE — BH Assessment (Signed)
Assessment Note  Teresa Sweeney is an 61 y.o. female presenting to the ED with reports that she was put on lyrica on 04/01/16 for leg pain and since then she has been having suicidal thoughts with plan of driving her car off a bridge or shooting herself with a gun which she does not have. Pt also reports she has been having fears of the dark and not sleeping well.  Diagnosis: Substance induced mood disorder  Past Medical History:  Past Medical History:  Diagnosis Date  . Hyperlipemia   . Hypertension     Past Surgical History:  Procedure Laterality Date  . ABDOMINAL HYSTERECTOMY      Family History:  Family History  Problem Relation Age of Onset  . Heart attack Mother   . Diabetes Sister   . Diabetes Other   . Hypertension Other   . Cancer Other     Social History:  reports that she has quit smoking. Her smoking use included Cigarettes. She smoked 1.00 pack per day. She has never used smokeless tobacco. She reports that she does not drink alcohol or use drugs.  Additional Social History:     CIWA: CIWA-Ar BP: (!) 149/80 Pulse Rate: (!) 101 COWS:    Allergies: No Known Allergies  Home Medications:  Medications Prior to Admission  Medication Sig Dispense Refill  . butalbital-acetaminophen-caffeine (FIORICET) 50-325-40 MG tablet Take 1-2 tablets by mouth every 6 (six) hours as needed for headache. (Patient not taking: Reported on 04/11/2016) 20 tablet 0  . ibuprofen (ADVIL,MOTRIN) 800 MG tablet Take 1 tablet (800 mg total) by mouth 3 (three) times daily. (Patient not taking: Reported on 04/11/2016) 21 tablet 0  . pravastatin (PRAVACHOL) 20 MG tablet Take 20 mg by mouth at bedtime.    . pregabalin (LYRICA) 50 MG capsule Take 50 mg by mouth daily.    Marland Kitchen triamterene-hydrochlorothiazide (DYAZIDE) 37.5-25 MG capsule Take 1 capsule by mouth daily.      OB/GYN Status:  No LMP recorded. Patient has had a hysterectomy.                                                                Disposition:     On Site Evaluation by:   Reviewed with Physician:    Oneita Hurt 04/11/2016 9:30 PM

## 2016-04-11 NOTE — ED Notes (Signed)

## 2016-04-11 NOTE — ED Notes (Signed)
BEHAVIORAL HEALTH ROUNDING Patient sleeping: Yes.   Patient alert and oriented: eyes closed  Appears to be asleep Behavior appropriate: Yes.  ; If no, describe:  Nutrition and fluids offered: Yes  Toileting and hygiene offered: sleeping Sitter present: q 15 minute observations and security monitoring Law enforcement present: yes  ODS 

## 2016-04-11 NOTE — ED Notes (Signed)
Pt niece Juanelle Desai [phone].

## 2016-04-11 NOTE — ED Notes (Signed)
PT  VOL  SEEN  BY  DR  CLAPACS  PENDING  PLACEMENT 

## 2016-04-11 NOTE — [facility] Counselor (Signed)
Per Dr. Weber Cooks, pt meets criteria for inpatient admission. Attending MD:  Dr. Bary Leriche Bed assignment:  311-B Staff notified Corky Sing, ED Nurse, EDP, patient registration)

## 2016-04-11 NOTE — ED Provider Notes (Signed)
[facility] Emergency Department Provider Note  ____________________________________________   First MD Initiated Contact with Patient 04/11/16 1034     (approximate)  I have reviewed the triage vital signs and the nursing notes.   HISTORY  Chief Complaint Medication Reaction    HPI Teresa Sweeney is a 61 y.o. female who self presents to the ED with anxiety and suicidal ideation.  She says she has a long standing history of moderate LLE pain radiating from her foot to her upper thigh.  10 days ago her PMD started her on Lyrica and she feels like it isn't helping.  For the past 3 days she has been unable to sleep secondary to anxiety.  She feels like the medication has made her paranoid and she's now afraid of the family dog and feels like people are out to hurt her.  No psychiatric history prior.  Takes unknown antihypertensive and cholesterol medication.   Past Medical History:  Diagnosis Date  . Hyperlipemia   . Hypertension     There are no active problems to display for this patient.   Past Surgical History:  Procedure Laterality Date  . ABDOMINAL HYSTERECTOMY      Prior to Admission medications   Medication Sig Start Date End Date Taking? Authorizing Provider  pravastatin (PRAVACHOL) 20 MG tablet Take 20 mg by mouth at bedtime. 09/22/15  Yes Historical Provider, MD  pregabalin (LYRICA) 50 MG capsule Take 50 mg by mouth daily.   Yes Historical Provider, MD  triamterene-hydrochlorothiazide (DYAZIDE) 37.5-25 MG capsule Take 1 capsule by mouth daily. 09/22/15  Yes Historical Provider, MD  butalbital-acetaminophen-caffeine (FIORICET) 50-325-40 MG tablet Take 1-2 tablets by mouth every 6 (six) hours as needed for headache. Patient not taking: Reported on 04/11/2016 09/30/15 09/29/16  Noemi Chapel, MD  ibuprofen (ADVIL,MOTRIN) 800 MG tablet Take 1 tablet (800 mg total) by mouth 3 (three) times daily. Patient not taking: Reported on 04/11/2016 09/30/15    Noemi Chapel, MD    Allergies Patient has no known allergies.  Family History  Problem Relation Age of Onset  . Heart attack Mother   . Diabetes Sister   . Diabetes Other   . Hypertension Other   . Cancer Other     Social History Social History  Substance Use Topics  . Smoking status: Former Smoker    Packs/day: 1.00    Types: Cigarettes  . Smokeless tobacco: Never Used  . Alcohol use No    Review of Systems Constitutional: No fever/chills Eyes: No visual changes. ENT: No sore throat. Cardiovascular: Denies chest pain. Respiratory: Denies shortness of breath. Gastrointestinal: No abdominal pain.  No nausea, no vomiting.  No diarrhea.  No constipation. Genitourinary: Negative for dysuria. Musculoskeletal: Negative for back pain. Skin: Negative for rash. Neurological: Negative for headaches, focal weakness or numbness. Psych: Anxiety and tearfulness depression and suicidality 10-point ROS otherwise negative.  ____________________________________________   PHYSICAL EXAM:  VITAL SIGNS: ED Triage Vitals [04/11/16 1005]  Enc Vitals Group     BP 125/86     Pulse Rate (!) 106     Resp 18     Temp 98.1 F (36.7 C)     Temp Source Oral     SpO2 98 %     Weight 150 lb (68 kg)     Height 5\' 1"  (1.549 m)     Head Circumference      Peak Flow      Pain Score 10     Pain  Loc      Pain Edu?      Excl. in [place]?     Constitutional: Alert and oriented 4 tearful pressured speech anxious appearing Eyes: Conjunctivae are normal. PERRL. EOMI. Head: Atraumatic. Nose: No congestion/rhinnorhea. Mouth/Throat: No tongue fasciculations Neck: No stridor.   Cardiovascular: Tachycardic rate, regular rhythm. Grossly normal heart sounds.  Good peripheral circulation. Respiratory: Normal respiratory effort.  No retractions. Lungs CTAB. Gastrointestinal: Soft and nontender. No distention. No abdominal bruits. No CVA tenderness. Musculoskeletal: No lower extremity tenderness nor  edema.  No joint effusions. No hand tremors Neurologic:  Normal speech and language. No gross focal neurologic deficits are appreciated. No gait instability. Skin:  Skin is warm, dry and intact. No rash noted. Psychiatric: Tearful anxious depressed affect  ____________________________________________   LABS (all labs ordered are listed, but only abnormal results are displayed)  Labs Reviewed  COMPREHENSIVE METABOLIC PANEL - Abnormal; Notable for the following:       Result Value   Glucose, Bld 205 (*)    BUN 33 (*)    Creatinine, Ser 1.06 (*)    Total Protein 8.7 (*)    AST 43 (*)    ALT 75 (*)    GFR calc non Af Amer 55 (*)    All other components within normal limits  ACETAMINOPHEN LEVEL - Abnormal; Notable for the following:    Acetaminophen (Tylenol), Serum <10 (*)    All other components within normal limits  ETHANOL  SALICYLATE LEVEL  CBC  CK  TSH  URINE DRUG SCREEN, QUALITATIVE ([facility] ONLY)   ____________________________________________  EKG  ED ECG REPORT I, Darel Hong, the attending physician, personally viewed and interpreted this ECG.  Date: 04/11/2016 Rate: 120 Rhythm: normal sinus rhythm QRS Axis: Leftward axis Intervals: normal ST/T Wave abnormalities: normal Conduction Disturbances: none Narrative Interpretation: Sinus tachycardia with nonspecific ST changes otherwise unremarkable EKG  ____________________________________________  RADIOLOGY   ____________________________________________   PROCEDURES  Procedure(s) performed: no  Procedures  Critical Care performed: no  ____________________________________________   INITIAL IMPRESSION / ASSESSMENT AND PLAN / ED COURSE  Pertinent labs & imaging results that were available during my care of the patient were reviewed by me and considered in my medical decision making (see chart for details).  On arrival the patient is tearful anxious tachycardic. She expresses concerning suicidality  saying that "I will kill myself if my pain does not improve". She's also demonstrating manic symptoms with lack of sleep and new fearfulness over the family dog. I'm concerned that she may complete his suicide if she were to be discharged at this point so perform a general workup and touch base with psychiatry. I am adding on a CK and a TSH.  Labs are unremarkable. The patient is medically stable for psychiatric evaluation.      ____________________________________________   FINAL CLINICAL IMPRESSION(S) / ED DIAGNOSES  Final diagnoses:  Depression, unspecified depression type      NEW MEDICATIONS STARTED DURING THIS VISIT:  New Prescriptions   No medications on file     Note:  This document was prepared using Dragon voice recognition software and may include unintentional dictation errors.     Darel Hong, MD 04/11/16 1539

## 2016-04-11 NOTE — Progress Notes (Signed)
Patient ID: Teresa Sweeney, female   DOB: 08/14/1955, 61 y.o.   MRN: [id number]  Ms Ardolino was nervous, anxious, frightened on admission to the unit, "I have never been in this type of environment before, I guess when I told them I wanted to kill myself by driving my car off the road, they sent me here!" Patient also c/o nerve pain from her left leg and on Lyrica; however, she blames her suicidal ideation on Lyrica. UDS + for benzodiazepine and is not listed on her PTA medications. Unit orientation completed, allowed to call her brother to give out password and discussed visiting hours; scheduled medications given, light turned on in her room, patient showered and allowed to rest.

## 2016-04-11 NOTE — ED Notes (Signed)
BEHAVIORAL HEALTH ROUNDING Patient sleeping: No. Patient alert and oriented: yes Behavior appropriate: Yes.  ; If no, describe:  Nutrition and fluids offered: yes Toileting and hygiene offered: Yes  Sitter present: q15 minute observations and security  monitoring Law enforcement present: Yes  ODS  

## 2016-04-11 NOTE — ED Notes (Signed)
BEHAVIORAL HEALTH ROUNDING Patient sleeping: No. Patient alert and oriented: yes Behavior appropriate: Yes.  ; If no, describe:  Nutrition and fluids offered: yes Toileting and hygiene offered: Yes  Sitter present: q15 minute observations and security monitor Law enforcement present: Yes  ODS

## 2016-04-11 NOTE — ED Notes (Signed)
Pt is somnolent and unresponsive at this time. TTS will attempt to reassess.

## 2016-04-11 NOTE — ED Notes (Signed)
Pt dressed out into appropriate hospital attire. Pt niece has belongings.

## 2016-04-11 NOTE — Consult Note (Signed)
[place] [facility] Consult   Reason for Consult:  Consult for 61 year old woman who presents to the emergency room with complaints that she is afraid of the dark Referring Physician:  Jimmye Norman Patient Identification: Teresa Sweeney MRN:  [id number] Principal Diagnosis: Substance induced mood disorder [facility]) Diagnosis:   Patient Active Problem List   Diagnosis Date Noted  . Substance induced mood disorder [facility]) [F19.94] 04/11/2016    Total Time spent with patient: 1 hour  Subjective:   Teresa Sweeney is a 61 y.o. female patient admitted with "I can't sleep and I'm afraid of the dark".  HPI:  Patient interviewed. Chart reviewed. Case discussed with nursing and TTS. 61 year old woman presented to the emergency room with a chief complaint that for the last week approximately she has not been able to sleep at night and that she feels afraid of the dark. I suggested to her that she leave the lights on when she sleeps and she says that does nothing to solve the problem, it is the fact that it is dark outside that still frightens her. She couldn't spontaneously tell me what she was afraid of. When I ask her if she was afraid that something was going to come and get her she said that was it. Patient denied that she was having auditory or visual hallucinations. She says that during the day her mood has been fine. Despite this she says she is having thoughts of killing herself and tells me she thought of driving her car off of the road. She claims all of this started only when she first started taking Lyrica which was approximately a week ago. Despite being convinced that it is related to the Lyrica she has continued taking the Lyrica right up until yesterday. She denies that she's been using any other drugs or alcohol. No other changes to medication. Won't tell me about any particular stresses that are going on.  Social history: Lives with her brother. Sounds like her normal lifestyle is  pretty empty. She says she does almost nothing. She quit her job a couple years ago by her report because she was tired of working.  Medical history: History of headaches. She also has had several emergency room visits over the last couple years all for somewhat vague complaints none of which have really panned out into a specific pathology.  Substance abuse history: Denies any history of alcohol or drug abuse  Past Psychiatric History: Denies any history of psychiatric history. Denies ever seeing a psychiatrist or mental health provider. No previous hospitalizations. No history of suicide attempts in the past. Nothing in the chart to suggest any past mental health history  Risk to Self: Is patient at risk for suicide?: Yes Risk to Others:   Prior Inpatient Therapy:   Prior Outpatient Therapy:    Past Medical History:  Past Medical History:  Diagnosis Date  . Hyperlipemia   . Hypertension     Past Surgical History:  Procedure Laterality Date  . ABDOMINAL HYSTERECTOMY     Family History:  Family History  Problem Relation Age of Onset  . Heart attack Mother   . Diabetes Sister   . Diabetes Other   . Hypertension Other   . Cancer Other    Family Psychiatric  History: Does not know of any Social History:  History  Alcohol Use No     History  Drug Use No    Social History   Social History  . Marital status: Single  Spouse name: N/A  . Number of children: N/A  . Years of education: N/A   Social History Main Topics  . Smoking status: Former Smoker    Packs/day: 1.00    Types: Cigarettes  . Smokeless tobacco: Never Used  . Alcohol use No  . Drug use: No  . Sexual activity: Not Asked   Other Topics Concern  . None   Social History Narrative  . None   Additional Social History:    Allergies:  No Known Allergies  Labs:  Results for orders placed or performed during the hospital encounter of 04/11/16 (from the past 48 hour(s))  Comprehensive metabolic panel      Status: Abnormal   Collection Time: 04/11/16 10:06 AM  Result Value Ref Range   Sodium 135 135 - 145 mmol/L   Potassium 3.7 3.5 - 5.1 mmol/L   Chloride 103 101 - 111 mmol/L   CO2 22 22 - 32 mmol/L   Glucose, Bld 205 (H) 65 - 99 mg/dL   BUN 33 (H) 6 - 20 mg/dL   Creatinine, Ser 1.06 (H) 0.44 - 1.00 mg/dL   Calcium 9.5 8.9 - 10.3 mg/dL   Total Protein 8.7 (H) 6.5 - 8.1 g/dL   Albumin 4.4 3.5 - 5.0 g/dL   AST 43 (H) 15 - 41 U/L   ALT 75 (H) 14 - 54 U/L   Alkaline Phosphatase 106 38 - 126 U/L   Total Bilirubin 0.3 0.3 - 1.2 mg/dL   GFR calc non Af Amer 55 (L) >60 mL/min   GFR calc Af Amer >60 >60 mL/min    Comment: (NOTE) The eGFR has been calculated using the CKD EPI equation. This calculation has not been validated in all clinical situations. eGFR's persistently <60 mL/min signify possible Chronic Kidney Disease.    Anion gap 10 5 - 15  Ethanol     Status: None   Collection Time: 04/11/16 10:06 AM  Result Value Ref Range   Alcohol, Ethyl (B) <5 <5 mg/dL    Comment:        LOWEST DETECTABLE LIMIT FOR SERUM ALCOHOL IS 5 mg/dL FOR MEDICAL PURPOSES ONLY   Salicylate level     Status: None   Collection Time: 04/11/16 10:06 AM  Result Value Ref Range   Salicylate Lvl <2.7 2.8 - 30.0 mg/dL  Acetaminophen level     Status: Abnormal   Collection Time: 04/11/16 10:06 AM  Result Value Ref Range   Acetaminophen (Tylenol), Serum <10 (L) 10 - 30 ug/mL    Comment:        THERAPEUTIC CONCENTRATIONS VARY SIGNIFICANTLY. A RANGE OF 10-30 ug/mL MAY BE AN EFFECTIVE CONCENTRATION FOR MANY PATIENTS. HOWEVER, SOME ARE BEST TREATED AT CONCENTRATIONS OUTSIDE THIS RANGE. ACETAMINOPHEN CONCENTRATIONS >150 ug/mL AT 4 HOURS AFTER INGESTION AND >50 ug/mL AT 12 HOURS AFTER INGESTION ARE OFTEN ASSOCIATED WITH TOXIC REACTIONS.   cbc     Status: None   Collection Time: 04/11/16 10:06 AM  Result Value Ref Range   WBC 6.4 3.6 - 11.0 K/uL   RBC 4.88 3.80 - 5.20 MIL/uL   Hemoglobin 13.5  12.0 - 16.0 g/dL   HCT 40.0 35.0 - 47.0 %   MCV 81.9 80.0 - 100.0 fL   MCH 27.5 26.0 - 34.0 pg   MCHC 33.6 32.0 - 36.0 g/dL   RDW 14.3 11.5 - 14.5 %   Platelets 232 150 - 440 K/uL  CK     Status: None   Collection Time: 04/11/16  10:06 AM  Result Value Ref Range   Total CK 138 38 - 234 U/L  TSH     Status: None   Collection Time: 04/11/16 10:06 AM  Result Value Ref Range   TSH 2.938 0.350 - 4.500 uIU/mL    Comment: Performed by a 3rd Generation assay with a functional sensitivity of <=0.01 uIU/mL.    Current Facility-Administered Medications  Medication Dose Route Frequency Provider Last Rate Last Dose  . pravastatin (PRAVACHOL) tablet 20 mg  20 mg Oral q1800 Gonzella Lex, MD      . triamterene-hydrochlorothiazide (MAXZIDE-25) 37.5-25 MG per tablet 1 tablet  1 tablet Oral Daily Gonzella Lex, MD       Current Outpatient Prescriptions  Medication Sig Dispense Refill  . pravastatin (PRAVACHOL) 20 MG tablet Take 20 mg by mouth at bedtime.    . pregabalin (LYRICA) 50 MG capsule Take 50 mg by mouth daily.    Marland Kitchen triamterene-hydrochlorothiazide (DYAZIDE) 37.5-25 MG capsule Take 1 capsule by mouth daily.    . butalbital-acetaminophen-caffeine (FIORICET) 50-325-40 MG tablet Take 1-2 tablets by mouth every 6 (six) hours as needed for headache. (Patient not taking: Reported on 04/11/2016) 20 tablet 0  . ibuprofen (ADVIL,MOTRIN) 800 MG tablet Take 1 tablet (800 mg total) by mouth 3 (three) times daily. (Patient not taking: Reported on 04/11/2016) 21 tablet 0    Musculoskeletal: Strength & Muscle Tone: within normal limits Gait & Station: normal Patient leans: N/A  Psychiatric Specialty Exam: Physical Exam  Nursing note and vitals reviewed. Constitutional: She appears well-developed and well-nourished.  HENT:  Head: Normocephalic and atraumatic.  Eyes: Conjunctivae are normal. Pupils are equal, round, and reactive to light.  Neck: Normal range of motion.  Cardiovascular: Regular rhythm  and normal heart sounds.   Respiratory: Effort normal. No respiratory distress.  GI: Soft.  Musculoskeletal: Normal range of motion.  Neurological: She is alert.  Skin: Skin is warm and dry.  Psychiatric: Her mood appears anxious. Her affect is blunt. Her speech is delayed. She is slowed and withdrawn. Thought content is paranoid. Cognition and memory are normal. She expresses impulsivity. She expresses suicidal ideation.    Review of Systems  Constitutional: Negative.   HENT: Negative.   Eyes: Negative.   Respiratory: Negative.   Cardiovascular: Negative.   Gastrointestinal: Negative.   Musculoskeletal: Negative.   Skin: Negative.   Neurological: Negative.   Psychiatric/Behavioral: Positive for suicidal ideas. Negative for depression, hallucinations, memory loss and substance abuse. The patient is nervous/anxious and has insomnia.     Blood pressure 125/86, pulse 96, temperature 98.1 F (36.7 C), temperature source Oral, resp. rate 18, height $RemoveBe'5\' 1"'eEtLscktw$  (1.549 m), weight 68 kg (150 lb), SpO2 98 %.Body mass index is 28.34 kg/m.  General Appearance: Fairly Groomed  Eye Contact:  Minimal  Speech:  Slow  Volume:  Decreased  Mood:  Anxious  Affect:  Constricted  Thought Process:  Goal Directed  Orientation:  Full (Time, Place, and Person)  Thought Content:  Illogical  Suicidal Thoughts:  Yes.  with intent/plan  Homicidal Thoughts:  No  Memory:  Immediate;   Good Recent;   Fair Remote;   Fair  Judgement:  Impaired  Insight:  Shallow  Psychomotor Activity:  Restlessness  Concentration:  Concentration: Poor  Recall:  [employer] of Knowledge:  Fair  Language:  Fair  Akathisia:  No  Handed:  Right  AIMS (if indicated):     Assets:  Housing  ADL's:  Intact  Cognition:  Impaired,  Mild  Sleep:        Treatment Plan Summary: Medication management and Plan 61 year old woman who presents with the above complaints. On mental status she was asleep most of the day today after having  been given some Ativan in the morning. When she woke up this afternoon and was interviewed she had an odd affect and presentation. Answered most questions with only a word or 2. Made very little eye contact. He tended to deny all symptoms except for the one chief complaint. Did not have much ability to converse. Came across as being cognitively impaired. Odd presentation of symptoms. Due to her continued statement about suicidality we will admit her to the hospital. She had some minor electrolyte abnormalities this morning on the labs but nothing that would preclude admission. Orders completed case reviewed with TTS. New medications other than when necessary.  Disposition: Recommend psychiatric Inpatient admission when medically cleared. Supportive therapy provided about ongoing stressors.  Alethia Berthold, MD 04/11/2016 4:55 PM

## 2016-04-11 NOTE — ED Notes (Signed)
Report given to Stiles, RN in lower level behavioral unit.

## 2016-04-11 NOTE — ED Triage Notes (Signed)
Pt reports that she was put on lyrica on 04/01/16 for leg pain and since then she has been having suicidal thoughts with plan of driving her car off a bridge or shooting herself with a gun which she does not have. Pt also reports she has been having fears of the dark and not sleeping well. Pt cooperative in triage.

## 2016-04-11 NOTE — ED Notes (Signed)
Niece allowed a visit - observed by pt relations

## 2016-04-11 NOTE — Progress Notes (Signed)
Patient ID: Teresa Sweeney, female   DOB: March 26, 1955, 61 y.o.   MRN: [id number] Patient searched for contraband none found. Skin checked with another nurse. Old scarring to ACW and abdomen. R/L blackened discoloration to great toe. Dry scaly heels to feet. Under left foot small circular callous noted. Skin warm dry and intact.

## 2016-04-12 ENCOUNTER — Encounter: Payer: Self-pay | Admitting: Psychiatry

## 2016-04-12 DIAGNOSIS — Z5181 Encounter for therapeutic drug level monitoring: Secondary | ICD-10-CM

## 2016-04-12 DIAGNOSIS — I10 Essential (primary) hypertension: Secondary | ICD-10-CM | POA: Diagnosis present

## 2016-04-12 DIAGNOSIS — E785 Hyperlipidemia, unspecified: Secondary | ICD-10-CM | POA: Diagnosis present

## 2016-04-12 DIAGNOSIS — F1994 Other psychoactive substance use, unspecified with psychoactive substance-induced mood disorder: Principal | ICD-10-CM

## 2016-04-12 LAB — LIPID PANEL
CHOL/HDL RATIO: 3.2 ratio
Cholesterol: 181 mg/dL (ref 0–200)
HDL: 56 mg/dL (ref >40)
LDL CALC: 102 mg/dL — AB (ref 0–99)
Triglycerides: 116 mg/dL (ref <150)
VLDL: 23 mg/dL (ref 0–40)

## 2016-04-12 MED ORDER — TRAZODONE HCL 50 MG PO TABS
50.0000 mg | ORAL_TABLET | Freq: Every evening | ORAL | Status: DC | PRN
  Administered 2016-04-13: 50 mg via ORAL
  Filled 2016-04-12: qty 1

## 2016-04-12 MED ORDER — NICOTINE 21 MG/24HR TD PT24
21.0000 mg | MEDICATED_PATCH | Freq: Every day | TRANSDERMAL | Status: DC
  Filled 2016-04-12 (×4): qty 1

## 2016-04-12 MED ORDER — GABAPENTIN 100 MG PO CAPS
100.0000 mg | ORAL_CAPSULE | Freq: Three times a day (TID) | ORAL | Status: DC
  Administered 2016-04-12 – 2016-04-15 (×10): 100 mg via ORAL
  Filled 2016-04-12 (×10): qty 1

## 2016-04-12 NOTE — H&P (Addendum)
Psychiatric Admission Assessment Adult  Patient Identification: AITHANA GUIN MRN:  [id number] Date of Evaluation:  04/12/2016 Chief Complaint:  Substance induced mood disorder Principal Diagnosis: Substance induced mood disorder ([place]) Diagnosis:   Patient Active Problem List   Diagnosis Date Noted  . HTN (hypertension) [I10] 04/12/2016  . Dyslipidemia [E78.5] 04/12/2016  . Substance induced mood disorder [facility]) [F19.94] 04/11/2016   History of Present Illness:   Identifying data. Miss Lesinski is a 61 year old female with no past psychiatric history.  Chief complaint. "I was afraid of the dark."  History of present illness. Information was obtained from the patient and the chart. Mrs. Hackl denies any symptoms of depression anxiety or psychosis prior to this week. On February 5 she was prescribed Lyrica for shooting pain and tingling in her legs. By the following week the patient started experiencing insomnia, fear of darkness, and suicidal ideation with a plan to drive her car off the road. In spite of side effects, the patient continued on Lyrica until February 13. She spoke with her niece was suggested that the patient comes to the hospital. She was admitted for further stabilization. The patient denies any symptoms of depression, anxiety, or psychosis other than paranoia. She denies symptoms suggestive of bipolar mania. She denies drugs or alcohol use. During the interview she appears somewhat disorganized, speech is pressured and garbled, she feels restless. Her thoughts of suicide are not as intrusive and she is able to contract for safety in the hospital. She still feels paranoid.   Past psychiatric history. She has never seen a psychiatrist, did not attempt suicide or was hospitalized.  Family psychiatric history. Nonreported.  Social history. In October 2017 she quit her job at [employer]. She cannot explain why. She has Fish farm manager. Her brother lives with her and she  considers it a good arrangement. She has never been married and has no children.  Total Time spent with patient: 1 hour  Is the patient at risk to self? Yes.    Has the patient been a risk to self in the past 6 months? No.  Has the patient been a risk to self within the distant past? No.  Is the patient a risk to others? No.  Has the patient been a risk to others in the past 6 months? No.  Has the patient been a risk to others within the distant past? No.   Prior Inpatient Therapy:   Prior Outpatient Therapy:    Alcohol Screening: 1. How often do you have a drink containing alcohol?: Never 2. How many drinks containing alcohol do you have on a typical day when you are drinking?: 1 or 2 3. How often do you have six or more drinks on one occasion?: Never Preliminary Score: 0 4. How often during the last year have you found that you were not able to stop drinking once you had started?: Never 5. How often during the last year have you failed to do what was normally expected from you becasue of drinking?: Never 6. How often during the last year have you needed a first drink in the morning to get yourself going after a heavy drinking session?: Never 7. How often during the last year have you had a feeling of guilt of remorse after drinking?: Never 8. How often during the last year have you been unable to remember what happened the night before because you had been drinking?: Never 9. Have you or someone else been injured as a result of  your drinking?: No 10. Has a relative or friend or a doctor or another health worker been concerned about your drinking or suggested you cut down?: No Alcohol Use Disorder Identification Test Final Score (AUDIT): 0 Substance Abuse History in the last 12 months:  No. Consequences of Substance Abuse: NA Previous Psychotropic Medications: No  Psychological Evaluations: No  Past Medical History:  Past Medical History:  Diagnosis Date  . Hyperlipemia   .  Hypertension     Past Surgical History:  Procedure Laterality Date  . ABDOMINAL HYSTERECTOMY     Family History:  Family History  Problem Relation Age of Onset  . Heart attack Mother   . Diabetes Sister   . Diabetes Other   . Hypertension Other   . Cancer Other    Tobacco Screening: Have you used any form of tobacco in the last 30 days? (Cigarettes, Smokeless Tobacco, Cigars, and/or Pipes): Yes Tobacco use, Select all that apply: 5 or more cigarettes per day Are you interested in Tobacco Cessation Medications?: No, patient refused Counseled patient on smoking cessation including recognizing danger situations, developing coping skills and basic information about quitting provided: Yes (Nicotine Patch 21 mg ) Social History:  History  Alcohol Use No     History  Drug Use No    Additional Social History:                           Allergies:  No Known Allergies Lab Results:  Results for orders placed or performed during the hospital encounter of 04/11/16 (from the past 48 hour(s))  Lipid panel     Status: Abnormal   Collection Time: 04/12/16  7:09 AM  Result Value Ref Range   Cholesterol 181 0 - 200 mg/dL   Triglycerides 116 <150 mg/dL   HDL 56 >40 mg/dL   Total CHOL/HDL Ratio 3.2 RATIO   VLDL 23 0 - 40 mg/dL   LDL Cholesterol 102 (H) 0 - 99 mg/dL    Comment:        Total Cholesterol/HDL:CHD Risk Coronary Heart Disease Risk Table                     Men   Women  1/2 Average Risk   3.4   3.3  Average Risk       5.0   4.4  2 X Average Risk   9.6   7.1  3 X Average Risk  23.4   11.0        Use the calculated Patient Ratio above and the CHD Risk Table to determine the patient's CHD Risk.        ATP III CLASSIFICATION (LDL):  <100     mg/dL   Optimal  100-129  mg/dL   Near or Above                    Optimal  130-159  mg/dL   Borderline  160-189  mg/dL   High  >190     mg/dL   Very High     Blood Alcohol level:  Lab Results  Component Value Date    ETH <5 04/11/2016   ETH <5 [id number]    Metabolic Disorder Labs:  No results found for: HGBA1C, MPG No results found for: PROLACTIN Lab Results  Component Value Date   CHOL 181 04/12/2016   TRIG 116 04/12/2016   HDL 56 04/12/2016   CHOLHDL 3.2 04/12/2016  VLDL 23 04/12/2016   LDLCALC 102 (H) 04/12/2016    Current Medications: Current Facility-Administered Medications  Medication Dose Route Frequency Provider Last Rate Last Dose  . acetaminophen (TYLENOL) tablet 650 mg  650 mg Oral Q6H PRN Gonzella Lex, MD   650 mg at 04/11/16 2132  . alum & mag hydroxide-simeth (MAALOX/MYLANTA) 200-200-20 MG/5ML suspension 30 mL  30 mL Oral Q4H PRN Gonzella Lex, MD      . magnesium hydroxide (MILK OF MAGNESIA) suspension 30 mL  30 mL Oral Daily PRN Gonzella Lex, MD      . nicotine (NICODERM CQ - dosed in mg/24 hours) patch 21 mg  21 mg Transdermal Daily Atticus Wedin B Lyllian Gause, MD      . pravastatin (PRAVACHOL) tablet 20 mg  20 mg Oral q1800 Gonzella Lex, MD      . temazepam (RESTORIL) capsule 7.5 mg  7.5 mg Oral QHS PRN Gonzella Lex, MD   7.5 mg at 04/11/16 2132  . triamterene-hydrochlorothiazide (MAXZIDE-25) 37.5-25 MG per tablet 1 tablet  1 tablet Oral Daily Gonzella Lex, MD   1 tablet at 04/12/16 0911   PTA Medications: Prescriptions Prior to Admission  Medication Sig Dispense Refill Last Dose  . butalbital-acetaminophen-caffeine (FIORICET) 50-325-40 MG tablet Take 1-2 tablets by mouth every 6 (six) hours as needed for headache. (Patient not taking: Reported on 04/11/2016) 20 tablet 0 Not Taking at Unknown time  . ibuprofen (ADVIL,MOTRIN) 800 MG tablet Take 1 tablet (800 mg total) by mouth 3 (three) times daily. (Patient not taking: Reported on 04/11/2016) 21 tablet 0 Not Taking at Unknown time  . pravastatin (PRAVACHOL) 20 MG tablet Take 20 mg by mouth at bedtime.   unknown at unknown  . pregabalin (LYRICA) 50 MG capsule Take 50 mg by mouth daily.   unknown at unknown  .  triamterene-hydrochlorothiazide (DYAZIDE) 37.5-25 MG capsule Take 1 capsule by mouth daily.   unknown at unknown    Musculoskeletal: Strength & Muscle Tone: within normal limits Gait & Station: normal Patient leans: N/A  Psychiatric Specialty Exam: Physical Exam  Nursing note and vitals reviewed. Psychiatric: Her behavior is normal. Judgment normal. Her speech is rapid and/or pressured. Thought content is paranoid and delusional. Cognition and memory are normal. She exhibits a depressed mood.    Review of Systems  Psychiatric/Behavioral: Positive for depression and suicidal ideas. The patient has insomnia.   All other systems reviewed and are negative.   Blood pressure 110/79, pulse (!) 101, temperature 98.4 F (36.9 C), temperature source Oral, resp. rate 18, height 5\' 1"  (1.549 m), weight 68 kg (150 lb), SpO2 100 %.Body mass index is 28.34 kg/m.  See SRA.                                                  Sleep:  Number of Hours: 7.3    Treatment Plan Summary: Daily contact with patient to assess and evaluate symptoms and progress in treatment and Medication management   Ms. Meulemans is a 61 year old female with no past psychiatric history admitted for paranoia and suicidal ideation in the context of treatment with Lyrica.  1. Suicidal ideation. The patient is able to contract for safety in the hospital.  2. Paranoia. The patient still feels paranoid.  3. Hypertension. She is on Maxzide.  4. Dyslipidemia. She is on  Pravachol.  5. Insomnia. She is on Restoril.  6. Smoking. Nicotine patch is available.  7. Leg pain. We will offer Neurontin.  8. Metabolic syndrome monitoring. Lipid panel is normal, hemoglobin A1c 8.6.  9. EKG. Pending.  10. Elevated BG and HgbA1c. We start monitoring.  11. Disposition. She will be discharged to home with her brother. She will follow up with [facility].   Observation Level/Precautions:  15 minute checks  Laboratory:   CBC Chemistry Profile UDS UA  Psychotherapy:    Medications:    Consultations:    Discharge Concerns:    Estimated LOS:  Other:     Physician Treatment Plan for Primary Diagnosis: Substance induced mood disorder ([place]) Long Term Goal(s): Improvement in symptoms so as ready for discharge  Short Term Goals: Ability to identify changes in lifestyle to reduce recurrence of condition will improve, Ability to verbalize feelings will improve, Ability to disclose and discuss suicidal ideas, Ability to demonstrate self-control will improve, Ability to identify and develop effective coping behaviors will improve, Ability to maintain clinical measurements within normal limits will improve, Compliance with prescribed medications will improve and Ability to identify triggers associated with substance abuse/mental health issues will improve  Physician Treatment Plan for Secondary Diagnosis: Principal Problem:   Substance induced mood disorder ([place]) Active Problems:   HTN (hypertension)   Dyslipidemia  Long Term Goal(s): NA  Short Term Goals: NA  I certify that inpatient services furnished can reasonably be expected to improve the patient's condition.    Orson Slick, MD 2/16/201811:13 AM

## 2016-04-12 NOTE — Plan of Care (Signed)
Problem: Coping: Goal: Ability to cope will improve Outcome: Progressing Working on  Radiographer, therapeutic

## 2016-04-12 NOTE — BHH Suicide Risk Assessment (Signed)
[facility] Admission Suicide Risk Assessment   Nursing information obtained from:  Patient, Review of record Demographic factors:  Low socioeconomic status, Unemployed Current Mental Status:  Suicide plan Loss Factors:  NA Historical Factors:  Impulsivity Risk Reduction Factors:  Living with another person, especially a relative, Positive therapeutic relationship  Total Time spent with patient: 1 hour Principal Problem: Substance induced mood disorder ([place]) Diagnosis:   Patient Active Problem List   Diagnosis Date Noted  . HTN (hypertension) [I10] 04/12/2016  . Dyslipidemia [E78.5] 04/12/2016  . Substance induced mood disorder [facility]) [F19.94] 04/11/2016   Subjective Data: suicidal ideation.  Continued Clinical Symptoms:  Alcohol Use Disorder Identification Test Final Score (AUDIT): 0 The "Alcohol Use Disorders Identification Test", Guidelines for Use in Primary Care, Second Edition.  World Pharmacologist [facility]). Score between 0-7:  no or low risk or alcohol related problems. Score between 8-15:  moderate risk of alcohol related problems. Score between 16-19:  high risk of alcohol related problems. Score 20 or above:  warrants further diagnostic evaluation for alcohol dependence and treatment.   CLINICAL FACTORS:   Depression:   Severe   Musculoskeletal: Strength & Muscle Tone: within normal limits Gait & Station: normal Patient leans: N/A  Psychiatric Specialty Exam: Physical Exam  Nursing note and vitals reviewed. Psychiatric: Her behavior is normal. Judgment normal. Her mood appears anxious. Her speech is rapid and/or pressured. Cognition and memory are normal. She expresses suicidal ideation. She expresses suicidal plans.    Review of Systems  Psychiatric/Behavioral: Positive for suicidal ideas.  All other systems reviewed and are negative.   Blood pressure 110/79, pulse (!) 101, temperature 98.4 F (36.9 C), temperature source Oral, resp. rate 18, height 5\' 1"  (1.549  m), weight 68 kg (150 lb), SpO2 100 %.Body mass index is 28.34 kg/m.  General Appearance: Casual  Eye Contact:  Good  Speech:  Pressured  Volume:  Normal  Mood:  Depressed  Affect:  Blunt  Thought Process:  Goal Directed and Descriptions of Associations: Intact  Orientation:  Full (Time, Place, and Person)  Thought Content:  Delusions and Paranoid Ideation  Suicidal Thoughts:  Yes.  with intent/plan  Homicidal Thoughts:  No  Memory:  Immediate;   Fair Recent;   Fair Remote;   Fair  Judgement:  Fair  Insight:  Fair  Psychomotor Activity:  Normal  Concentration:  Concentration: Fair and Attention Span: Fair  Recall:  [employer] of Knowledge:  Fair  Language:  Fair  Akathisia:  No  Handed:  Right  AIMS (if indicated):     Assets:  Communication Skills Desire for Improvement Financial Resources/Insurance Housing Resilience Social Support  ADL's:  Intact  Cognition:  WNL  Sleep:  Number of Hours: 7.3      COGNITIVE FEATURES THAT CONTRIBUTE TO RISK:  None    SUICIDE RISK:   Moderate:  Frequent suicidal ideation with limited intensity, and duration, some specificity in terms of plans, no associated intent, good self-control, limited dysphoria/symptomatology, some risk factors present, and identifiable protective factors, including available and accessible social support.  PLAN OF CARE: Hospital admission, medication management, discharge planning.  Teresa Sweeney is a 61 year old female with no past psychiatric history admitted for paranoia and suicidal ideation in the context of treatment with Lyrica.  1. Suicidal ideation. The patient is able to contract for safety in the hospital.  2. Paranoia. The patient still feels paranoid.  3. Hypertension. She is on Maxzide.  4. Dyslipidemia. She is on Pravachol.  5. Insomnia. She is on Restoril.  6. Smoking. Nicotine patch is available.  7. Leg pain. We will offer Neurontin.  8. Metabolic syndrome monitoring. Lipid panel  and hemoglobin A1c are pending.  9. EKG. Pending.  10. Disposition. She will be discharged to home with her brother. She will follow up with [facility].  I certify that inpatient services furnished can reasonably be expected to improve the patient's condition.   Orson Slick, MD 04/12/2016, 11:03 AM

## 2016-04-12 NOTE — Progress Notes (Signed)
D:Patient voice of wanting to retire in January of 2018 , stated she just went on and retired  Last year . Continue to voice of the Lyrica   Giving her harrmful side effect . Stated she got suicidal and became afraid of the dark .  Patient has a room mate . Stated she  Took a bath this am shift , but remains to wear scrubs . Limited eye contact  When in conversation with Probation officer.   Patient stated slept good last night .Stated appetite is good and energy level  Is normal. Stated concentration is good . Stated on Depression scale  7, hopeless 7 and anxiety  7.( low 0-10 high) Denies suicidal  homicidal ideations  .  No auditory hallucinations  No pain concerns .  Interacting with peers and staff.  Encourage Patient to come to staff for any ;concerns  A: Encourage patient participation with unit programming . Instruction  Given on  Medication , verbalize understanding. R: Voice no other concerns. Staff continue to monitor

## 2016-04-12 NOTE — Progress Notes (Signed)
Recreation Therapy Notes  Date: 02.16.18 Time: 9:30 am Location: Craft Room  Group Topic: Coping Skills  Goal Area(s) Addresses:  Patient will participate in healthy coping skill. Patient will verbalize benefit of using art as a coping skill.  Behavioral Response: Attentive, Left early  Intervention: Coloring  Activity: Patients were given coloring sheets to color and were instructed to think about the emotions they were feeling as well as what their minds were focused on.  Education: LRT educated patients on healthy coping skills.  Education Outcome: Patient left before LRT educated group.   Clinical Observations/Feedback: Patient colored coloring sheet. Patient left group at approximately 10:10 am with the chaplain.  Teresa, Sweeney, LRT/CTRS 04/12/2016 10:24 AM

## 2016-04-12 NOTE — Tx Team (Signed)
Initial Treatment Plan 04/12/2016 3:23 AM Teresa Sweeney [id number]    PATIENT STRESSORS: Other: I am afraid of the Dark, I can't go to sleep, I am going crazy and I feel like driving my car off the road. Nyctophobia   PATIENT STRENGTHS: Average or above average intelligence Capable of independent living Motivation for treatment/growth   PATIENT IDENTIFIED PROBLEMS: Suicidal Thoughts  Mood Instabilty  Nerve Pain ...left leg                  DISCHARGE CRITERIA:  Improved stabilization in mood, thinking, and/or behavior Motivation to continue treatment in a less acute level of care Reduction of life-threatening or endangering symptoms to within safe limits  PRELIMINARY DISCHARGE PLAN: Placement in alternative living arrangements Return to previous living arrangement  PATIENT/FAMILY INVOLVEMENT: This treatment plan has been presented to and reviewed with the patient, Teresa Sweeney.  The patient have been given the opportunity to ask questions and make suggestions.  Electa Sniff, RN 04/12/2016, 3:23 AM

## 2016-04-12 NOTE — BHH Group Notes (Signed)
[place] LCSW Group Therapy Note  Date/Time: 04/12/16, 1300  Type of Therapy and Topic:  Group Therapy:  Feelings around Relapse and Recovery  Participation Level:  Active   Mood: pleasant  Description of Group:    Patients in this group will discuss emotions they experience before and after a relapse. They will process how experiencing these feelings, or avoidance of experiencing them, relates to having a relapse. Facilitator will guide patients to explore emotions they have related to recovery. Patients will be encouraged to process which emotions are more powerful. They will be guided to discuss the emotional reaction significant others in their lives may have to patients' relapse or recovery. Patients will be assisted in exploring ways to respond to the emotions of others without this contributing to a relapse.  Therapeutic Goals: 1. Patient will identify two or more emotions that lead to relapse for them:  2. Patient will identify two emotions that result when they relapse:  3. Patient will identify two emotions related to recovery:  4. Patient will demonstrate ability to communicate their needs through discussion and/or role plays.   Summary of Patient Progress: Pt identified "stressed" as an emotion that she struggles to cope with.  She did contribute to the discussion regarding triggers and trying to develop a relapse prevention plan.     Therapeutic Modalities:   Cognitive Behavioral Therapy Solution-Focused Therapy Assertiveness Training Relapse Prevention Therapy  Lurline Idol, LCSW

## 2016-04-12 NOTE — BHH Counselor (Signed)
Adult Comprehensive Assessment  Patient ID: Teresa Sweeney, female   DOB: 15-Oct-1955, 61 y.o.   MRN: [id number]  Information Source: Information source: Patient  Current Stressors:  Physical health (include injuries & life threatening diseases): pt reports a medication reaction to Lyrica, which was given for leg pain  Living/Environment/Situation:  Living Arrangements: Other relatives (with brother) Living conditions (as described by patient or guardian): good How long has patient lived in current situation?: 75 + years What is atmosphere in current home: Comfortable  Family History:  Marital status: Single Does patient have children?: No  Childhood History:  By whom was/is the patient raised?: Mother Additional childhood history information: Pt reports her parents were never married and her father left the home when she was a teen Description of patient's relationship with caregiver when they were a child: positive with mother, negative with father Patient's description of current relationship with people who raised him/her: both parents now deceased How were you disciplined when you got in trouble as a child/adolescent?: no discipline needed.  Pt states she was well behaved. Does patient have siblings?: Yes Number of Siblings: 7 Description of patient's current relationship with siblings: 1 sibling is deceased.  Pt gets along well with the rest. Did patient suffer any verbal/emotional/physical/sexual abuse as a child?: No Did patient suffer from severe childhood neglect?: No Has patient ever been sexually abused/assaulted/raped as an adolescent or adult?: No Was the patient ever a victim of a crime or a disaster?: No Witnessed domestic violence?: No Has patient been effected by domestic violence as an adult?: No  Education:  Highest grade of school patient has completed: HS graduate Currently a Ship broker?: No Learning disability?: No  Employment/Work Situation:    Employment situation: Unemployed Patient's job has been impacted by current illness: No What is the longest time patient has a held a job?: 17 years Where was the patient employed at that time?: [place] patient ever been in the [employer]?: No Has patient ever served in combat?:  (na) Did You Receive Any Psychiatric Treatment/Services While in Passenger transport manager?:  (na) Are There Guns or Other Weapons in [place]?: No Are These Weapons Safely Secured?:  (na)  Financial Resources:   Financial resources:  (pt receives pension.  Social security will begin at the end of this month.)  Alcohol/Substance Abuse:   What has been your use of drugs/alcohol within the last 12 months?: Pt denies any use. Alcohol/Substance Abuse Treatment Hx: Denies past history Has alcohol/substance abuse ever caused legal problems?: No  Social Support System:   Patient's Community Support System: Good Describe Community Support System: extended family: siblins and their children. Type of faith/religion: Baptist How does patient's faith help to cope with current illness?: Pt faith helps to handle difficult times.  Leisure/Recreation:   Leisure and Hobbies: Engineer, maintenance (IT).  Strengths/Needs:   What things does the patient do well?: Pt unable to come up with answer. In what areas does patient struggle / problems for patient: Pt unable to come up with answer.  Discharge Plan:   Does patient have access to transportation?: Yes Will patient be returning to same living situation after discharge?: Yes Currently receiving community mental health services: No If no, would patient like referral for services when discharged?:  (unsure at this time.) Does patient have financial barriers related to discharge medications?: No  Summary/Recommendations:   Summary and Recommendations (to be completed by the evaluator): Pt is 61 year old female from [place]. ([place])  Pt diagnosed with substance  induced mood disorder and admitted after developing depression and suicidal ideation after starting a medication for leg pain.  Recommendations for pt included crisis stabilization, therapeutic milieu, attend and particiapte in groups, medication management, and development of comprehensive mental wellness program.  Joanne Chars. 04/12/2016

## 2016-04-12 NOTE — Plan of Care (Signed)
Problem: Activity: Goal: Sleeping patterns will improve Outcome: Progressing Patient slept for Estimated Hours of 7.35; every 15 minutes safety round maintained, no injury or falls during this shift.

## 2016-04-12 NOTE — BHH Suicide Risk Assessment (Signed)
[place] INPATIENT:  Family/Significant Other Suicide Prevention Education  Suicide Prevention Education:  Education Completed; Shakena Melchor, brother, [phone], has been identified by the patient as the family member/significant other with whom the patient will be residing, and identified as the person(s) who will aid the patient in the event of a mental health crisis (suicidal ideations/suicide attempt).  With written consent from the patient, the family member/significant other has been provided the following suicide prevention education, prior to the and/or following the discharge of the patient.  The suicide prevention education provided includes the following:  Suicide risk factors  Suicide prevention and interventions  National Suicide Hotline telephone number  [facility] assessment telephone number  [facility] Emergency Assistance [place] and/or Residential Mobile Crisis Unit telephone number  Request made of family/significant other to:  Remove weapons (e.g., guns, rifles, knives), all items previously/currently identified as safety concern.  No guns in home, according to brother.  Remove drugs/medications (over-the-counter, prescriptions, illicit drugs), all items previously/currently identified as a safety concern.  The family member/significant other verbalizes understanding of the suicide prevention education information provided.  The family member/significant other agrees to remove the items of safety concern listed above.  Joanne Chars, LCSW 04/12/2016, 4:07 PM

## 2016-04-12 NOTE — Progress Notes (Signed)
1945: Patient in the dayroom with visitor. Pleasant and cooperative. Alert and oriented. Interacting with visitors appropriately. Staff continue to monitor for safety and other needs.

## 2016-04-12 NOTE — Progress Notes (Signed)
Recreation Therapy Notes  INPATIENT RECREATION THERAPY ASSESSMENT  Patient Details Name: Teresa Sweeney MRN: [id number] DOB: April 05, 1955 Today's Date: 04/12/2016  Patient Stressors:  Patient reported no stressors.  Coping Skills:   Isolate, Music, Sports  Personal Challenges: Anger, Concentration, Decision-Making, Expressing Yourself, Problem-Solving, Relationships, Self-Esteem/Confidence, Social Interaction, Stress Management, Trusting Others  Leisure Interests (2+):  Games - Cards, Games - Other (Comment) ([place])  Awareness of Community Resources:  Yes  Community Resources:  Other (Comment) Interior and spatial designer)  Current Use: No  If no, Barriers?: Other (Comment) (Doesn't know)  Patient Strengths:  "Not really"  Patient Identified Areas of Improvement:  No  Current Recreation Participation:  Playing cards  Patient Goal for Hospitalization:  To get well  [place] of Residence:  [place] of Residence:  [place]   Current SI (including self-harm):  No  Current HI:  No  Consent to Intern Participation: N/A   J[place], [place], LRT/CTRS 04/12/2016, 3:37 PM

## 2016-04-12 NOTE — Progress Notes (Signed)
EKG completed and given to RN

## 2016-04-13 LAB — HEMOGLOBIN A1C
Hgb A1c MFr Bld: 8.6 % — ABNORMAL HIGH (ref 4.8–5.6)
Mean Plasma Glucose: 200 mg/dL

## 2016-04-13 MED ORDER — TRAZODONE HCL 100 MG PO TABS
100.0000 mg | ORAL_TABLET | Freq: Every evening | ORAL | Status: DC | PRN
  Administered 2016-04-13: 100 mg via ORAL
  Filled 2016-04-13: qty 1

## 2016-04-13 NOTE — Progress Notes (Signed)
61 year old female admitted on 04/11/16 with recent mood changes with recent use of Lyrica which is now discontinued. Today she is AAOx4, clean, compliant with medications. Reports depression 8/10 and is occasionally weepy on the unit. She has continued complaints of lack of sleep last night, new prn trazadone order placed for bedtime. Denies SI/HI, AVH.  Will monitor, provide cares as ordered, and continue to provide therapeutic interventions as needed.

## 2016-04-13 NOTE — Plan of Care (Signed)
Problem: Activity: Goal: Interest or engagement in activities will improve Outcome: Progressing Pt was more visible in the milieu. Presenting to the nurses station to express her concerns as needed

## 2016-04-13 NOTE — Progress Notes (Signed)
Patient stayed in the milieu after visiting with family. Attended group, had a snack and medication. Complained of restlessness and insomnia: Received Trazodone 50mg  PO per MD recommendation. Currently sleeping. No sign of distress. Safety maintained on the unit.

## 2016-04-13 NOTE — Plan of Care (Signed)
Problem: Medication: Goal: Compliance with prescribed medication regimen will improve Outcome: Progressing Taking medications voluntarily as prescribed

## 2016-04-13 NOTE — Plan of Care (Signed)
Problem: Activity: Goal: Interest or engagement in leisure activities will improve Outcome: Not Progressing Reports depression 10/10 and desire to remain in room. Encourage to engage in unit routine, therapeutic cares provided.

## 2016-04-13 NOTE — BHH Group Notes (Signed)
[place] LCSW Group Therapy  04/13/2016 3:37 PM  Type of Therapy:  Group Therapy  Participation Level:  Minimal  Participation Quality:  Attentive  Affect:  Appropriate  Cognitive:  Alert  Insight:  Improving  Engagement in Therapy:  Limited  Modes of Intervention:  Activity, Discussion, Education, Problem-solving, Reality Testing and Support  Summary of Progress/Problems: Self-responsibility/accountability- Patients discussed self responsibility/accountability  and how it impacts them. Patients were asked to define these concepts in their own words. They discussed taking ownership of their actions and the challenges they have with taking accountability for their self. CSW introduced "YOU vs. I" statements and explained how "I" statements identifies how they feel about the situation without being threatening or offensive to others and could help improve their communication with others. Examples were provided of each. They were challenged to identify changes that are needed in order to improve self responsibility/accountability. CSW provided inspirational quotes that focused on the patients taking accountability for actions both good and bad. Patients were asked to read the quotes out loud and share their thoughts with the group about their quote.  Asaiah Hunnicutt G. [place], [place] 04/13/2016, 3:38 PM

## 2016-04-13 NOTE — Progress Notes (Signed)
Teresa Sweeney was flat and sad on approach. She was medication compliant although. Patient reports anxiety and not being able to sleep. Given PRN trazodone and Restoril to help patient sleep.  Patient appears to be resting in bed no issues to report on shift at this time.

## 2016-04-13 NOTE — Progress Notes (Signed)
Patient came to nurses station complaining of not being able to sleep even after taking two different sleeping medications. Advised patient to talk with the doctor. Patient went back to room.

## 2016-04-13 NOTE — Progress Notes (Signed)
[facility] MD Progress Note  04/13/2016 12:59 PM Teresa Sweeney  MRN:  [id number] Subjective:   Recent mood changes with recent use of Lyrica which is now discontinued. Pt continue to report sadness, could not sleep last night with Restoril and 50 mg Trazodone. Pt anxious, labile, denies SI.  Principal Problem: Substance induced mood disorder ([place]) Diagnosis:   Patient Active Problem List   Diagnosis Date Noted  . HTN (hypertension) [I10] 04/12/2016  . Dyslipidemia [E78.5] 04/12/2016  . Substance induced mood disorder ([place]) [F19.94] 04/11/2016   Total Time spent with patient: 30 minutes  Past Psychiatric History:   Past Medical History:  Past Medical History:  Diagnosis Date  . Hyperlipemia   . Hypertension     Past Surgical History:  Procedure Laterality Date  . ABDOMINAL HYSTERECTOMY     Family History:  Family History  Problem Relation Age of Onset  . Heart attack Mother   . Diabetes Sister   . Diabetes Other   . Hypertension Other   . Cancer Other    Family Psychiatric  History:  Social History:  History  Alcohol Use No     History  Drug Use No    Social History   Social History  . Marital status: Single    Spouse name: N/A  . Number of children: N/A  . Years of education: N/A   Social History Main Topics  . Smoking status: Former Smoker    Packs/day: 1.00    Types: Cigarettes  . Smokeless tobacco: Never Used  . Alcohol use No  . Drug use: No  . Sexual activity: Not Asked   Other Topics Concern  . None   Social History Narrative  . None   Additional Social History:                         Sleep: Poor  Appetite:  Fair  Current Medications: Current Facility-Administered Medications  Medication Dose Route Frequency Provider Last Rate Last Dose  . acetaminophen (TYLENOL) tablet 650 mg  650 mg Oral Q6H PRN Gonzella Lex, MD   650 mg at 04/11/16 2132  . alum & mag hydroxide-simeth (MAALOX/MYLANTA) 200-200-20 MG/5ML suspension 30 mL   30 mL Oral Q4H PRN Gonzella Lex, MD      . gabapentin (NEURONTIN) capsule 100 mg  100 mg Oral TID Clovis Fredrickson, MD   100 mg at 04/13/16 1120  . magnesium hydroxide (MILK OF MAGNESIA) suspension 30 mL  30 mL Oral Daily PRN Gonzella Lex, MD      . nicotine (NICODERM CQ - dosed in mg/24 hours) patch 21 mg  21 mg Transdermal Daily Jolanta B Pucilowska, MD      . pravastatin (PRAVACHOL) tablet 20 mg  20 mg Oral q1800 Gonzella Lex, MD   20 mg at 04/12/16 1844  . temazepam (RESTORIL) capsule 7.5 mg  7.5 mg Oral QHS PRN Gonzella Lex, MD   7.5 mg at 04/12/16 2208  . traZODone (DESYREL) tablet 50 mg  50 mg Oral QHS PRN Lenward Chancellor, MD   50 mg at 04/13/16 0012  . triamterene-hydrochlorothiazide (MAXZIDE-25) 37.5-25 MG per tablet 1 tablet  1 tablet Oral Daily Gonzella Lex, MD   1 tablet at 04/13/16 [id number]    Lab Results:  Results for orders placed or performed during the hospital encounter of 04/11/16 (from the past 48 hour(s))  Hemoglobin A1c     Status: Abnormal  Collection Time: 04/12/16  7:09 AM  Result Value Ref Range   Hgb A1c MFr Bld 8.6 (H) 4.8 - 5.6 %    Comment: (NOTE)         Pre-diabetes: 5.7 - 6.4         Diabetes: >6.4         Glycemic control for adults with diabetes: <7.0    Mean Plasma Glucose 200 mg/dL    Comment: (NOTE) Performed At: [facility] [place], [place] [id number] Lindon Romp MD [id number]   Lipid panel     Status: Abnormal   Collection Time: 04/12/16  7:09 AM  Result Value Ref Range   Cholesterol 181 0 - 200 mg/dL   Triglycerides 116 <150 mg/dL   HDL 56 >40 mg/dL   Total CHOL/HDL Ratio 3.2 RATIO   VLDL 23 0 - 40 mg/dL   LDL Cholesterol 102 (H) 0 - 99 mg/dL    Comment:        Total Cholesterol/HDL:CHD Risk Coronary Heart Disease Risk Table                     Men   Women  1/2 Average Risk   3.4   3.3  Average Risk       5.0   4.4  2 X Average Risk   9.6   7.1  3 X Average Risk  23.4   11.0        Use  the calculated Patient Ratio above and the CHD Risk Table to determine the patient's CHD Risk.        ATP III CLASSIFICATION (LDL):  <100     mg/dL   Optimal  100-129  mg/dL   Near or Above                    Optimal  130-159  mg/dL   Borderline  160-189  mg/dL   High  >190     mg/dL   Very High     Blood Alcohol level:  Lab Results  Component Value Date   ETH <5 04/11/2016   ETH <5 123XX123    Metabolic Disorder Labs: Lab Results  Component Value Date   HGBA1C 8.6 (H) 04/12/2016   MPG 200 04/12/2016   No results found for: PROLACTIN Lab Results  Component Value Date   CHOL 181 04/12/2016   TRIG 116 04/12/2016   HDL 56 04/12/2016   CHOLHDL 3.2 04/12/2016   VLDL 23 04/12/2016   LDLCALC 102 (H) 04/12/2016    Physical Findings: AIMS: Facial and Oral Movements Muscles of Facial Expression: None, normal Lips and Perioral Area: None, normal Jaw: None, normal Tongue: None, normal,Extremity Movements Upper (arms, wrists, hands, fingers): None, normal Lower (legs, knees, ankles, toes): None, normal, Trunk Movements Neck, shoulders, hips: None, normal, Overall Severity Severity of abnormal movements (highest score from questions above): None, normal Incapacitation due to abnormal movements: None, normal Patient's awareness of abnormal movements (rate only patient's report): No Awareness, Dental Status Current problems with teeth and/or dentures?: No Does patient usually wear dentures?: Yes  CIWA:    COWS:     Musculoskeletal: Strength & Muscle Tone: within normal limits Gait & Station: normal Patient leans: N/A  Psychiatric Specialty Exam: Physical Exam  Nursing note and vitals reviewed.   Review of Systems  Psychiatric/Behavioral: Positive for depression. The patient is nervous/anxious and has insomnia.     Blood pressure 128/85, pulse (!) 108,  temperature 98.8 F (37.1 C), temperature source Oral, resp. rate 18, height 5\' 1"  (1.549 m), weight 68 kg (150  lb), SpO2 100 %.Body mass index is 28.34 kg/m.  General Appearance: poorly groomed  Eye Contact:  Poor  Speech:  Normal Rate  Volume:  Normal  Mood:  Anxious and Depressed  Affect:  Labile  Thought Process:  Goal Directed  Orientation:  Full (Time, Place, and Person)  Thought Content:  Preoccupied with inability to sleep  Suicidal Thoughts:  denies  Homicidal Thoughts:  No  Memory:  intact  Judgement:  Fair  Insight:  Fair  Psychomotor Activity:  Normal  Concentration:  poor  Recall:  3/3  Fund of Knowledge:  Good  Language:  intact  Akathisia:  No  Handed:    AIMS (if indicated):     Assets:  Asking for help  ADL's:  Intact  Cognition:  WNL  Sleep:  Number of Hours: 4.25     Treatment Plan Summary: Daily contact with patient to assess and evaluate symptoms and progress in treatment and Medication management   Teresa Sweeney is a 61 year old female with no past psychiatric history admitted for paranoia and suicidal ideation in the context of treatment with Lyrica.  1. Suicidal ideation. The patient is able to contract for safety in the hospital.  2. Paranoia. The patient still feels paranoid.  3. Hypertension. She is on Maxzide.  4. Dyslipidemia. She is on Pravachol.  5. Insomnia.  Will increase trazodone . She is on Restoril.  6. Smoking. Nicotine patch is available.  7. Leg pain.  Neurontin.  8. Metabolic syndrome monitoring. Lipid panel is normal, hemoglobin A1c 8.6.  9. EKG. Pending.  10. Elevated BG and HgbA1c. We start monitoring.  11. Disposition. She will be discharged to home with her brother. She will follow up with [facility].  Lenward Chancellor, MD 04/13/2016, 12:59 PM

## 2016-04-14 MED ORDER — TRAZODONE HCL 100 MG PO TABS
200.0000 mg | ORAL_TABLET | Freq: Every evening | ORAL | Status: DC | PRN
  Administered 2016-04-14: 200 mg via ORAL
  Filled 2016-04-14: qty 2

## 2016-04-14 MED ORDER — QUETIAPINE FUMARATE 25 MG PO TABS
50.0000 mg | ORAL_TABLET | Freq: Every day | ORAL | Status: DC
  Administered 2016-04-14: 50 mg via ORAL
  Filled 2016-04-14: qty 2

## 2016-04-14 NOTE — Plan of Care (Signed)
Problem: Health Behavior/Discharge Planning: Goal: Ability to manage health-related needs will improve Outcome: Progressing Patient medication compliant.

## 2016-04-14 NOTE — [facility] Group Notes (Signed)
[facility] LCSW Group Therapy  04/14/2016 3:04 PM  Type of Therapy:  Group Therapy  Participation Level:  Minimal  Participation Quality:  Attentive  Affect:  Appropriate and Lethargic  Cognitive:  Alert  Insight:  Improving  Engagement in Therapy:  Limited  Modes of Intervention:  Activity, Discussion, Education, Problem-solving, Reality Testing, Socialization and Support  Summary of Progress/Problems: Recognizing Triggers: Patients defined triggers and discussed the importance of recognizing their personal warning signs. Patients identified their own triggers and how they tend to cope with stressful situations. Patients discussed areas such as people, places, things, and thoughts that rigger certain emotions for them. CSW provided support to patients and discussed safety planning for when these triggers occur. Group participants had opportunities to share openly with the group and participate in a group discussion while providing support and feedback to their peers. Patient stated she was not doing well because she was sleeping during the night. CSW informed nursing staff.   Shivaan Tierno G. [place], [place] 04/14/2016, 3:05 PM

## 2016-04-14 NOTE — Progress Notes (Signed)
[facility] MD Progress Note  04/14/2016 11:14 AM Teresa Sweeney  MRN:  [id number] Subjective:   Recent mood changes with recent use of Lyrica which is now discontinued. Pt labile, very anxious, preoccupied with insomnia, got 150 mg Trazodone last night.  Pt continue to report sadness,  denies SI.  Principal Problem: Substance induced mood disorder ([place]) Diagnosis:   Patient Active Problem List   Diagnosis Date Noted  . HTN (hypertension) [I10] 04/12/2016  . Dyslipidemia [E78.5] 04/12/2016  . Substance induced mood disorder ([place]) [F19.94] 04/11/2016   Total Time spent with patient: 30 minutes  Past Psychiatric History:   Past Medical History:  Past Medical History:  Diagnosis Date  . Hyperlipemia   . Hypertension     Past Surgical History:  Procedure Laterality Date  . ABDOMINAL HYSTERECTOMY     Family History:  Family History  Problem Relation Age of Onset  . Heart attack Mother   . Diabetes Sister   . Diabetes Other   . Hypertension Other   . Cancer Other    Family Psychiatric  History:  Social History:  History  Alcohol Use No     History  Drug Use No    Social History   Social History  . Marital status: Single    Spouse name: N/A  . Number of children: N/A  . Years of education: N/A   Social History Main Topics  . Smoking status: Former Smoker    Packs/day: 1.00    Types: Cigarettes  . Smokeless tobacco: Never Used  . Alcohol use No  . Drug use: No  . Sexual activity: Not Asked   Other Topics Concern  . None   Social History Narrative  . None   Additional Social History:                         Sleep: Poor  Appetite:  Fair  Current Medications: Current Facility-Administered Medications  Medication Dose Route Frequency Provider Last Rate Last Dose  . acetaminophen (TYLENOL) tablet 650 mg  650 mg Oral Q6H PRN Gonzella Lex, MD   650 mg at 04/11/16 2132  . alum & mag hydroxide-simeth (MAALOX/MYLANTA) 200-200-20 MG/5ML suspension  30 mL  30 mL Oral Q4H PRN Gonzella Lex, MD      . gabapentin (NEURONTIN) capsule 100 mg  100 mg Oral TID Clovis Fredrickson, MD   100 mg at 04/14/16 0826  . magnesium hydroxide (MILK OF MAGNESIA) suspension 30 mL  30 mL Oral Daily PRN Gonzella Lex, MD      . nicotine (NICODERM CQ - dosed in mg/24 hours) patch 21 mg  21 mg Transdermal Daily Jolanta B Pucilowska, MD      . pravastatin (PRAVACHOL) tablet 20 mg  20 mg Oral q1800 Gonzella Lex, MD   20 mg at 04/13/16 1750  . QUEtiapine (SEROQUEL) tablet 50 mg  50 mg Oral QHS Lenward Chancellor, MD      . temazepam (RESTORIL) capsule 7.5 mg  7.5 mg Oral QHS PRN Gonzella Lex, MD   7.5 mg at 04/12/16 2208  . traZODone (DESYREL) tablet 200 mg  200 mg Oral QHS PRN Lenward Chancellor, MD      . triamterene-hydrochlorothiazide (MAXZIDE-25) 37.5-25 MG per tablet 1 tablet  1 tablet Oral Daily Gonzella Lex, MD   1 tablet at 04/14/16 [id number]    Lab Results:  No results found for this or any previous visit (from the  past 48 hour(s)).  Blood Alcohol level:  Lab Results  Component Value Date   ETH <5 04/11/2016   ETH <5 123XX123    Metabolic Disorder Labs: Lab Results  Component Value Date   HGBA1C 8.6 (H) 04/12/2016   MPG 200 04/12/2016   No results found for: PROLACTIN Lab Results  Component Value Date   CHOL 181 04/12/2016   TRIG 116 04/12/2016   HDL 56 04/12/2016   CHOLHDL 3.2 04/12/2016   VLDL 23 04/12/2016   LDLCALC 102 (H) 04/12/2016    Physical Findings: AIMS: Facial and Oral Movements Muscles of Facial Expression: None, normal Lips and Perioral Area: None, normal Jaw: None, normal Tongue: None, normal,Extremity Movements Upper (arms, wrists, hands, fingers): None, normal Lower (legs, knees, ankles, toes): None, normal, Trunk Movements Neck, shoulders, hips: None, normal, Overall Severity Severity of abnormal movements (highest score from questions above): None, normal Incapacitation due to abnormal movements: None,  normal Patient's awareness of abnormal movements (rate only patient's report): No Awareness, Dental Status Current problems with teeth and/or dentures?: No Does patient usually wear dentures?: Yes  CIWA:    COWS:     Musculoskeletal: Strength & Muscle Tone: within normal limits Gait & Station: normal Patient leans: N/A  Psychiatric Specialty Exam: Physical Exam  Nursing note and vitals reviewed.   Review of Systems  Psychiatric/Behavioral: Positive for depression. The patient is nervous/anxious and has insomnia.     Blood pressure 128/81, pulse (!) 106, temperature 98.4 F (36.9 C), temperature source Oral, resp. rate 18, height 5\' 1"  (1.549 m), weight 68 kg (150 lb), SpO2 100 %.Body mass index is 28.34 kg/m.  General Appearance: poorly groomed  Eye Contact:  Poor  Speech:  Normal Rate  Volume:  Normal  Mood:  Anxious and Depressed  Affect:  Labile  Thought Process:  Goal Directed  Orientation:  Full (Time, Place, and Person)  Thought Content:  Preoccupied with inability to sleep  Suicidal Thoughts:  denies  Homicidal Thoughts:  No  Memory:  intact  Judgement:  Fair  Insight:  Fair  Psychomotor Activity:  Normal  Concentration:  poor  Recall:  3/3  Fund of Knowledge:  Good  Language:  intact  Akathisia:  No  Handed:    AIMS (if indicated):     Assets:  Asking for help  ADL's:  Intact  Cognition:  WNL  Sleep:  4hrs     Treatment Plan Summary: Daily contact with patient to assess and evaluate symptoms and progress in treatment and Medication management   Teresa Sweeney is a 61 year old female with no past psychiatric history admitted for paranoia and suicidal ideation in the context of treatment with Lyrica. Pt has poor sleep with mood lability and anxiety.   will add seroquel 50mg  qhs for mood/sleep.  1. Suicidal ideation. The patient is able to contract for safety in the hospital.  2. Paranoia. The patient still feels paranoid.  3. Hypertension. She is  on Maxzide.  4. Dyslipidemia. She is on Pravachol.  5. Insomnia.   increase trazodone to 200 mg qhs . She is on Restoril.  6. Smoking. Nicotine patch is available.  7. Leg pain.  Neurontin.  8. Metabolic syndrome monitoring. Lipid panel is normal, hemoglobin A1c 8.6.  9. EKG. QTc-428.  10. Elevated BG and HgbA1c. We start monitoring.  11. Disposition. She will be discharged to home with her brother. She will follow up with [facility].  Lenward Chancellor, MD 04/14/2016, 11:14 AMPatient ID: Teresa Sweeney, female  DOB: 10-Dec-1955, 61 y.o.   MRN: [id number]

## 2016-04-14 NOTE — Progress Notes (Signed)
Patient was upset and teary this morning when writer approached her. She stated that she felt sad, hopeless, anxious and depressed and that she did not sleep much at all last night. "I haven't slept in three days." Patient was consoled and encouraged to rest. MD notified of this and new medication orders obtained. Patient brightened up as the day went on. She attended groups and was seen interacting with peers. She denied SI/HI/AVH and contracted for safety. Will continue to monitor to ensure safety.

## 2016-04-15 DIAGNOSIS — E1140 Type 2 diabetes mellitus with diabetic neuropathy, unspecified: Secondary | ICD-10-CM | POA: Diagnosis present

## 2016-04-15 DIAGNOSIS — E119 Type 2 diabetes mellitus without complications: Secondary | ICD-10-CM

## 2016-04-15 LAB — GLUCOSE, CAPILLARY
GLUCOSE-CAPILLARY: 155 mg/dL — AB (ref 65–99)
GLUCOSE-CAPILLARY: 177 mg/dL — AB (ref 65–99)
GLUCOSE-CAPILLARY: 186 mg/dL — AB (ref 65–99)
GLUCOSE-CAPILLARY: 219 mg/dL — AB (ref 65–99)
GLUCOSE-CAPILLARY: 184 mg/dL — AB (ref 65–99)
Glucose-Capillary: 164 mg/dL — ABNORMAL HIGH (ref 65–99)

## 2016-04-15 MED ORDER — METFORMIN HCL 500 MG PO TABS: 500.0000 mg | ORAL_TABLET | Freq: Two times a day (BID) | ORAL | Status: DC

## 2016-04-15 MED ORDER — GABAPENTIN 100 MG PO CAPS: 100.0000 mg | ORAL_CAPSULE | Freq: Three times a day (TID) | ORAL | 1 refills | Status: DC

## 2016-04-15 MED ORDER — METFORMIN HCL 500 MG PO TABS: 500.0000 mg | ORAL_TABLET | Freq: Two times a day (BID) | ORAL | 1 refills | Status: DC

## 2016-04-15 MED ORDER — QUETIAPINE FUMARATE 50 MG PO TABS: 100.0000 mg | ORAL_TABLET | Freq: Every day | ORAL | 1 refills | Status: DC

## 2016-04-15 NOTE — Plan of Care (Signed)
Problem: [facility] Participation in Recreation Therapeutic Interventions Goal: STG-Patient will demonstrate improved self esteem by identif STG: Self-Esteem - Within 3 treatment sessions, patient will verbalize at least 5 positive affirmation statements in one treatment session to increase self-esteem.  Outcome: Completed/Met Date Met: 04/15/16 Treatment Session 1; Completed 1 out of 2: At approximately 10:30 am, LRT met with patient in consultation room. Patient verbalized 5 positive affirmation statements. Patient reported it felt "good". LRT encouraged patient to continue saying positive affirmation statements.  Leonette Monarch, LRT/CTRS 02.19.18 11:49 am Goal: STG-Other Recreation Therapy Goal (Specify) STG: Stress Management - Within 3 treatment sessions, patient will verbalize understanding of the stress management techniques in one treatment session to increase stress management skills.  Outcome: Completed/Met Date Met: 04/15/16 Treatment Session 1; Completed 1 out of 2: At approximately 10:30 am, LRT met with patient in consultation room. LRT educated and provided patient with handouts on stress management techniques. Patient verbalized understanding. LRT encouraged patient to read over and practice the stress management techniques.  Leonette Monarch, LRT/CTRS 02.19.18 11:51 am

## 2016-04-15 NOTE — Progress Notes (Signed)
Pt awake, alert, oriented and up on unit this morning. Reports good sleep last night with the help of sleep medication, fair appetite, normal energy, good concentration. Rates depression 3/10, hopelessness 3/10, anxiety 3/10 (low 0-10 high). Denies SI/HI/AVH, pain. Pleasant and cooperative, brighter on approach, less depressed/sad. Medication compliant. Somewhat isolative to room, only comes out of room for meals/meds. Goal today is "get well to go home" by "stay focused." Support and encouragement provided. Medications administered as ordered with education. Safety maintained with every 15 minute checks. Will continue to monitor .

## 2016-04-15 NOTE — [facility] Group Notes (Signed)
[facility] Group Notes:  (Nursing/MHT/Case Management/Adjunct)  Date:  04/15/2016  Time:  12:58 AM  Type of Therapy:  Group Therapy  Participation Level:  Active  Participation Quality:  Appropriate  Affect:  Appropriate  Cognitive:  Appropriate  Insight:  Appropriate  Engagement in Group:  Engaged  Modes of Intervention:  Discussion  Summary of Progress/Problems: Pt stated that her goal was to get some sleep this evening because she has not been asleep in two days . Staff made pt. Aware that if for some reason she could not sleep to let staff know. Pt stated that she would.   Jenetta Downer Haeli Gerlich 04/15/2016, 12:58 AM

## 2016-04-15 NOTE — Progress Notes (Signed)
Recreation Therapy Notes  Date: 02.19.18 Time: 9:30 am Location: Day Room on [employer]   Group Topic: Self-expression   Goal Area(s) Addresses:  Patient will be able to identify a color that represents each emotion. Patient will verbalize benefit of using as a means of self-expression. Patient will verbalize one emotion experienced while participating in activity.   Behavioral Response: Attentive, Interactive   Intervention: The Colors Within Me   Activity: Patients were given a blank face worksheet and were instructed to pick a color for each emotion they were feeling and show on the worksheet how much of that emotion they were feeling.   Education: LRT educated patients on other forms of self-expression.   Education Outcome: Acknowledges education/In group clarification offered   Clinical Observations/Feedback: Patient picked a color for each emotion she was feeling and showed on the worksheet how much of that emotion she was feeling. Patient contributed to group discussion by stating what emotions she was feeling, what she can do to maintain beng happy, and why it is important to express her emotions.  Bennette, [place], LRT/CTRS 04/15/2016 10:15 AM

## 2016-04-15 NOTE — Progress Notes (Signed)
  [facility] Adult Case Management Discharge Plan :  Will you be returning to the same living situation after discharge:  Yes,  with brother At discharge, do you have transportation home?: Yes,  brother Do you have the ability to pay for your medications: Yes,  BCBS  Release of information consent forms completed and in the chart;  Patient's signature needed at discharge.  Patient to Follow up at: [facility]. Go on 04/19/2016.   Why:  Please attend your follow up appointment with Neysa Hotter, PA on 04/19/16 at 1030am.  Please bring photo ID and your hospital discharge paperwork. Contact information: [address] [place], [place] [address] P: [phone] F: [phone]          Next level of care provider has access to [facility] and Suicide Prevention discussed: Yes,  with brother  Have you used any form of tobacco in the last 30 days? (Cigarettes, Smokeless Tobacco, Cigars, and/or Pipes): Yes  Has patient been referred to the Quitline?: N/A patient is not a smoker  Patient has been referred for addiction treatment: N/A  Joanne Chars, LCSW 04/15/2016, 10:36 AM

## 2016-04-15 NOTE — Plan of Care (Signed)
Problem: Safety: Goal: Periods of time without injury will increase Outcome: Progressing Pt safe on the unit at this time   

## 2016-04-15 NOTE — Progress Notes (Signed)
Provided and reviewed discharge paperwork and prescriptions. Verified understanding by use of teach back method. Verbalizes understanding as well. Pt denies SI/HI/AVH, ready for discharge. Pt reports her brother will pick her up and transport her home. Pt did not bring in belongings, is only wearing her glasses. Safety maintained. Will discharge when pt's brother arrives and continue to monitor.

## 2016-04-15 NOTE — Progress Notes (Signed)
Recreation Therapy Notes  INPATIENT RECREATION TR PLAN  Patient Details Name: LAYNE LEBON MRN: [id number] DOB: [birth year] Today's Date: 04/15/2016  Rec Therapy Plan Is patient appropriate for Therapeutic Recreation?: Yes Treatment times per week: At least once a week TR Treatment/Interventions: 1:1 session, Group participation (Comment) (Appropriate participation in daily recreational therapy tx)  Discharge Criteria Pt will be discharged from therapy if:: Treatment goals are met, Discharged Treatment plan/goals/alternatives discussed and agreed upon by:: Patient/family  Discharge Summary Short term goals set: See Care Plan Short term goals met: Complete Progress toward goals comments: One-to-one attended Which groups?: Coping skills, Other (Comment) (Self-expression) One-to-one attended: Self-esteem, stress management Reason goals not met: N/A Therapeutic equipment acquired: None Reason patient discharged from therapy: Discharge from hospital Pt/family agrees with progress & goals achieved: Yes Date patient discharged from therapy: 04/15/16   Leonette Monarch, LRT/CTRS 04/15/2016, 2:32 PM

## 2016-04-15 NOTE — Progress Notes (Signed)
D: Pt denies SI/HI/AVH. Pt is pleasant and cooperative. Pt seen interacting with peers. Pt stated she started to feel bad after her doctor put her on Lyrica and she started to feel bad.   A: Pt was offered support and encouragement. Pt was given scheduled medications. Pt was encourage to attend groups. Q 15 minute checks were done for safety.   R:Pt attends groups and interacts well with peers and staff. Pt is taking medication. Pt has no complaints at this time .Pt receptive to treatment and safety maintained on unit.

## 2016-04-15 NOTE — [facility] Suicide Risk Assessment (Signed)
[facility] Discharge Suicide Risk Assessment   Principal Problem: Substance induced mood disorder [facility]) Discharge Diagnoses:  Patient Active Problem List   Diagnosis Date Noted  . HTN (hypertension) [I10] 04/12/2016  . Dyslipidemia [E78.5] 04/12/2016  . Substance induced mood disorder ([place]) [F19.94] 04/11/2016    Total Time spent with patient: 30 minutes  Musculoskeletal: Strength & Muscle Tone: within normal limits Gait & Station: normal Patient leans: N/A  Psychiatric Specialty Exam: Review of Systems  Musculoskeletal: Positive for myalgias.  All other systems reviewed and are negative.   Blood pressure (!) 136/99, pulse (!) 119, temperature 98.9 F (37.2 C), temperature source Oral, resp. rate 18, height 5\' 1"  (1.549 m), weight 68 kg (150 lb), SpO2 100 %.Body mass index is 28.34 kg/m.  General Appearance: Casual  Eye Contact::  Good  Speech:  Clear and Coherent409  Volume:  Normal  Mood:  Euthymic  Affect:  Appropriate  Thought Process:  Goal Directed and Descriptions of Associations: Intact  Orientation:  Full (Time, Place, and Person)  Thought Content:  WDL  Suicidal Thoughts:  No  Homicidal Thoughts:  No  Memory:  Immediate;   Fair Recent;   Fair Remote;   Fair  Judgement:  Fair  Insight:  Fair  Psychomotor Activity:  Normal  Concentration:  Fair  Recall:  [employer] of [place]  Language: Fair  Akathisia:  No  Handed:  Right  AIMS (if indicated):     Assets:  Communication Skills Desire for Improvement Financial Resources/Insurance Housing Physical Health Resilience Social Support  Sleep:  Number of Hours: 6.3  Cognition: WNL  ADL's:  Intact   Mental Status Per Nursing Assessment::   On Admission:  Suicide plan  Demographic Factors:  NA  Loss Factors: Decline in physical health  Historical Factors: NA  Risk Reduction Factors:   Sense of responsibility to family, Living with another person, especially a relative, Positive social support  and Positive therapeutic relationship  Continued Clinical Symptoms:  Depression:   Recent sense of peace/wellbeing Chronic Pain  Cognitive Features That Contribute To Risk:  None    Suicide Risk:  Minimal: No identifiable suicidal ideation.  Patients presenting with no risk factors but with morbid ruminations; may be classified as minimal risk based on the severity of the depressive symptoms    Plan Of Care/Follow-up recommendations:  Activity:  as tolerated. Diet:  low sodium heart healthy ADA diet. Other:  keep follow up appointments.  Orson Slick, MD 04/15/2016, 9:55 AM

## 2016-04-15 NOTE — Discharge Summary (Signed)
Physician Discharge Summary Note  Patient:  Teresa Sweeney is an 61 y.o., female MRN:  [id number] DOB:  [birth year] Patient phone:  [phone] (home)  Patient address:   [place] [address],  Total Time spent with patient: 30 minutes  Date of Admission:  04/11/2016 Date of Discharge: 04/15/2016  Reason for Admission:  Paranoia, insomnia, suicidal ideation.  Identifying data. Teresa Sweeney is a 61 year old female with no past psychiatric history.  Chief complaint. "I was afraid of the dark."  History of present illness. Information was obtained from the patient and the chart. Teresa Sweeney denies any symptoms of depression anxiety or psychosis prior to this week. On February 5 she was prescribed Lyrica for shooting pain and tingling in her legs. By the following week the patient started experiencing insomnia, fear of darkness, and suicidal ideation with a plan to drive her car off the road. In spite of side effects, the patient continued on Lyrica until February 13. She spoke with her niece was suggested that the patient comes to the hospital. She was admitted for further stabilization. The patient denies any symptoms of depression, anxiety, or psychosis other than paranoia. She denies symptoms suggestive of bipolar mania. She denies drugs or alcohol use. During the interview she appears somewhat disorganized, speech is pressured and garbled, she feels restless. Her thoughts of suicide are not as intrusive and she is able to contract for safety in the hospital. She still feels paranoid.   Past psychiatric history. She has never seen a psychiatrist, did not attempt suicide or was hospitalized.  Family psychiatric history. Nonreported.  Social history. In October 2017 she quit her job at [employer]. She cannot explain why. She has Fish farm manager. Her brother lives with her and she considers it a good arrangement. She has never been married and has no children.  Principal  Problem: Substance or medication-induced bipolar and related disorder [facility]) Discharge Diagnoses: Patient Active Problem List   Diagnosis Date Noted  . Diabetes ([place]) [E11.9] 04/15/2016  . Diabetic neuropathy ([place]) [E11.40] 04/15/2016  . HTN (hypertension) [I10] 04/12/2016  . Dyslipidemia [E78.5] 04/12/2016  . Substance or medication-induced bipolar and related disorder [facility]) [F19.94] 04/11/2016   Past Medical History:  Past Medical History:  Diagnosis Date  . Hyperlipemia   . Hypertension     Past Surgical History:  Procedure Laterality Date  . ABDOMINAL HYSTERECTOMY     Family History:  Family History  Problem Relation Age of Onset  . Heart attack Mother   . Diabetes Sister   . Diabetes Other   . Hypertension Other   . Cancer Other     Social History:  History  Alcohol Use No     History  Drug Use No    Social History   Social History  . Marital status: Single    Spouse name: N/A  . Number of children: N/A  . Years of education: N/A   Social History Main Topics  . Smoking status: Former Smoker    Packs/day: 1.00    Types: Cigarettes  . Smokeless tobacco: Never Used  . Alcohol use No  . Drug use: No  . Sexual activity: Not Asked   Other Topics Concern  . None   Social History Narrative  . None    Hospital Course:    Teresa Sweeney is a 61 year old female with no past psychiatric history admitted for paranoia and suicidal ideation in the context of treatment with Lyrica.   1. Suicidal ideation.  Resolved. The patient is able to contract for safety. She is forward thinking and optimistic about the future.   2. Paranoia. Lyrica was discontinued. The patient was started on Seroquel for psychosis, anxiety and mood stabilization.   3. Hypertension. She is on Maxzide.  4. Dyslipidemia. She is on Pravachol.  5. Insomnia. Resolved with Seroquel.   6. Smoking. Nicotine patch is available.  7. Metabolic syndrome monitoring. Lipid panel is normal,  hemoglobin A1c 8.6.  8. Elevated BG and HgbA1c. We started Metformin.   9. Diabetic neuropathy. We started Neurontin.  10. EKG. Normal sinus rhythm. QTc-428.  11. Disposition. She was discharged to home with her brother. She will follow up with her primary provider. Information about local mental health options were given but the patient is not interested.    Physical Findings: AIMS: Facial and Oral Movements Muscles of Facial Expression: None, normal Lips and Perioral Area: None, normal Jaw: None, normal Tongue: None, normal,Extremity Movements Upper (arms, wrists, hands, fingers): None, normal Lower (legs, knees, ankles, toes): None, normal, Trunk Movements Neck, shoulders, hips: None, normal, Overall Severity Severity of abnormal movements (highest score from questions above): None, normal Incapacitation due to abnormal movements: None, normal Patient's awareness of abnormal movements (rate only patient's report): No Awareness, Dental Status Current problems with teeth and/or dentures?: No Does patient usually wear dentures?: Yes  CIWA:    COWS:     Musculoskeletal: Strength & Muscle Tone: within normal limits Gait & Station: normal Patient leans: N/A  Psychiatric Specialty Exam: Physical Exam  Nursing note and vitals reviewed. Psychiatric: She has a normal mood and affect. Her speech is normal and behavior is normal. Judgment and thought content normal. Cognition and memory are normal.    Review of Systems  Musculoskeletal: Positive for myalgias.  All other systems reviewed and are negative.   Blood pressure (!) 136/99, pulse (!) 119, temperature 98.9 F (37.2 C), temperature source Oral, resp. rate 18, height 5\' 1"  (1.549 m), weight 68 kg (150 lb), SpO2 100 %.Body mass index is 28.34 kg/m.  General Appearance: Casual  Eye Contact:  Good  Speech:  Clear and Coherent  Volume:  Normal  Mood:  Euthymic  Affect:  Appropriate  Thought Process:  Goal Directed and  Descriptions of Associations: Intact  Orientation:  Full (Time, Place, and Person)  Thought Content:  WDL  Suicidal Thoughts:  No  Homicidal Thoughts:  No  Memory:  Immediate;   Fair Recent;   Fair Remote;   Fair  Judgement:  Fair  Insight:  Fair  Psychomotor Activity:  Normal  Concentration:  Concentration: Fair and Attention Span: Fair  Recall:  [employer] of Knowledge:  Fair  Language:  Fair  Akathisia:  No  Handed:  Right  AIMS (if indicated):     Assets:  Communication Skills Desire for Improvement Financial Resources/Insurance Housing Physical Health Resilience Social Support Transportation  ADL's:  Intact  Cognition:  WNL  Sleep:  Number of Hours: 6.3     Have you used any form of tobacco in the last 30 days? (Cigarettes, Smokeless Tobacco, Cigars, and/or Pipes): Yes  Has this patient used any form of tobacco in the last 30 days? (Cigarettes, Smokeless Tobacco, Cigars, and/or Pipes) Yes, Yes, A prescription for an FDA-approved tobacco cessation medication was offered at discharge and the patient refused  Blood Alcohol level:  Lab Results  Component Value Date   [facility] <5 04/11/2016   ETH <5 [id number]    Metabolic Disorder Labs:  Lab Results  Component Value Date   HGBA1C 8.6 (H) 04/12/2016   MPG 200 04/12/2016   No results found for: PROLACTIN Lab Results  Component Value Date   CHOL 181 04/12/2016   TRIG 116 04/12/2016   HDL 56 04/12/2016   CHOLHDL 3.2 04/12/2016   VLDL 23 04/12/2016   LDLCALC 102 (H) 04/12/2016    See Psychiatric Specialty Exam and Suicide Risk Assessment completed by Attending Physician prior to discharge.  Discharge destination:  Home  Is patient on multiple antipsychotic therapies at discharge:  No   Has Patient had three or more failed trials of antipsychotic monotherapy by history:  No  Recommended Plan for Multiple Antipsychotic Therapies: NA  Discharge Instructions    Diet - low sodium heart healthy    Complete by:   As directed    Increase activity slowly    Complete by:  As directed      Allergies as of 04/15/2016   No Known Allergies     Medication List    STOP taking these medications   butalbital-acetaminophen-caffeine 50-325-40 MG tablet Commonly known as:  FIORICET   ibuprofen 800 MG tablet Commonly known as:  ADVIL,MOTRIN   pregabalin 50 MG capsule Commonly known as:  LYRICA     TAKE these medications     Indication  gabapentin 100 MG capsule Commonly known as:  NEURONTIN Take 1 capsule (100 mg total) by mouth 3 (three) times daily.  Indication:  Neuropathic Pain   metFORMIN 500 MG tablet Commonly known as:  GLUCOPHAGE Take 1 tablet (500 mg total) by mouth 2 (two) times daily with a meal.  Indication:  Type 2 Diabetes   pravastatin 20 MG tablet Commonly known as:  PRAVACHOL Take 20 mg by mouth at bedtime.  Indication:  High Amount of Fats in the Blood   QUEtiapine 50 MG tablet Commonly known as:  SEROQUEL Take 2 tablets (100 mg total) by mouth at bedtime.  Indication:  Depressive Phase of Manic-Depression   triamterene-hydrochlorothiazide 37.5-25 MG capsule Commonly known as:  DYAZIDE Take 1 capsule by mouth daily.  Indication:  High Blood Pressure Disorder        Follow-up recommendations:  Activity:  as tolerated. Diet:  low sodium heart healthy ADA diet. Other:  keep follow up appointments.  Comments:    Signed: Orson Slick, MD 04/15/2016, 10:05 AM

## 2016-04-16 LAB — GLUCOSE, CAPILLARY: Glucose-Capillary: 268 mg/dL — ABNORMAL HIGH (ref 65–99)

## 2016-05-13 ENCOUNTER — Other Ambulatory Visit: Payer: Self-pay | Admitting: Psychiatry

## 2017-06-06 ENCOUNTER — Other Ambulatory Visit: Payer: Self-pay

## 2017-06-06 ENCOUNTER — Emergency Department (HOSPITAL_COMMUNITY)
Admission: EM | Admit: 2017-06-06 | Discharge: 2017-06-06 | Disposition: A | Discharge status: Home / Self Care | Payer: BLUE CROSS/BLUE SHIELD | Attending: Emergency Medicine | Admitting: Emergency Medicine

## 2017-06-06 ENCOUNTER — Encounter (HOSPITAL_COMMUNITY): Payer: Self-pay | Admitting: Emergency Medicine

## 2017-06-06 DIAGNOSIS — Z7984 Long term (current) use of oral hypoglycemic drugs: Secondary | ICD-10-CM | POA: Insufficient documentation

## 2017-06-06 DIAGNOSIS — F419 Anxiety disorder, unspecified: Secondary | ICD-10-CM | POA: Insufficient documentation

## 2017-06-06 DIAGNOSIS — Z79899 Other long term (current) drug therapy: Secondary | ICD-10-CM | POA: Insufficient documentation

## 2017-06-06 DIAGNOSIS — F329 Major depressive disorder, single episode, unspecified: Secondary | ICD-10-CM | POA: Insufficient documentation

## 2017-06-06 DIAGNOSIS — G4700 Insomnia, unspecified: Secondary | ICD-10-CM | POA: Diagnosis present

## 2017-06-06 DIAGNOSIS — R319 Hematuria, unspecified: Secondary | ICD-10-CM | POA: Insufficient documentation

## 2017-06-06 DIAGNOSIS — Z87891 Personal history of nicotine dependence: Secondary | ICD-10-CM | POA: Diagnosis not present

## 2017-06-06 DIAGNOSIS — I10 Essential (primary) hypertension: Secondary | ICD-10-CM | POA: Insufficient documentation

## 2017-06-06 DIAGNOSIS — E1140 Type 2 diabetes mellitus with diabetic neuropathy, unspecified: Secondary | ICD-10-CM | POA: Insufficient documentation

## 2017-06-06 DIAGNOSIS — Z7982 Long term (current) use of aspirin: Secondary | ICD-10-CM | POA: Diagnosis not present

## 2017-06-06 DIAGNOSIS — R000 Tachycardia, unspecified: Secondary | ICD-10-CM | POA: Insufficient documentation

## 2017-06-06 DIAGNOSIS — F5101 Primary insomnia: Secondary | ICD-10-CM | POA: Diagnosis not present

## 2017-06-06 HISTORY — DX: Depression, unspecified: F32.A

## 2017-06-06 HISTORY — DX: Anxiety disorder, unspecified: F41.9

## 2017-06-06 HISTORY — DX: Major depressive disorder, single episode, unspecified: F32.9

## 2017-06-06 HISTORY — DX: Type 2 diabetes mellitus without complications: E11.9

## 2017-06-06 LAB — URINALYSIS, ROUTINE W REFLEX MICROSCOPIC
BILIRUBIN URINE: NEGATIVE
Glucose, UA: NEGATIVE mg/dL
KETONES UR: NEGATIVE mg/dL
NITRITE: NEGATIVE
PH: 6 (ref 5.0–8.0)
Protein, ur: NEGATIVE mg/dL
Specific Gravity, Urine: 1.017 (ref 1.005–1.030)

## 2017-06-06 LAB — CBC WITH DIFFERENTIAL/PLATELET
Basophils Absolute: 0 10*3/uL (ref 0.0–0.1)
Basophils Relative: 1 %
EOS ABS: 0.2 10*3/uL (ref 0.0–0.7)
Eosinophils Relative: 4 %
HCT: 39.2 % (ref 36.0–46.0)
Hemoglobin: 12 g/dL (ref 12.0–15.0)
LYMPHS ABS: 2.1 10*3/uL (ref 0.7–4.0)
Lymphocytes Relative: 37 %
MCH: 25.3 pg — AB (ref 26.0–34.0)
MCHC: 30.6 g/dL (ref 30.0–36.0)
MCV: 82.7 fL (ref 78.0–100.0)
MONO ABS: 0.4 10*3/uL (ref 0.1–1.0)
MONOS PCT: 6 %
Neutro Abs: 2.9 10*3/uL (ref 1.7–7.7)
Neutrophils Relative %: 52 %
PLATELETS: 255 10*3/uL (ref 150–400)
RBC: 4.74 MIL/uL (ref 3.87–5.11)
RDW: 15.2 % (ref 11.5–15.5)
WBC: 5.6 10*3/uL (ref 4.0–10.5)

## 2017-06-06 LAB — COMPREHENSIVE METABOLIC PANEL
ALK PHOS: 80 U/L (ref 38–126)
ALT: 29 U/L (ref 14–54)
ANION GAP: 16 — AB (ref 5–15)
AST: 26 U/L (ref 15–41)
Albumin: 4.1 g/dL (ref 3.5–5.0)
BUN: 28 mg/dL — ABNORMAL HIGH (ref 6–20)
CALCIUM: 9.6 mg/dL (ref 8.9–10.3)
CHLORIDE: 102 mmol/L (ref 101–111)
CO2: 24 mmol/L (ref 22–32)
Creatinine, Ser: 1.02 mg/dL — ABNORMAL HIGH (ref 0.44–1.00)
GFR calc non Af Amer: 57 mL/min — ABNORMAL LOW (ref >60)
Glucose, Bld: 85 mg/dL (ref 65–99)
POTASSIUM: 3.5 mmol/L (ref 3.5–5.1)
SODIUM: 142 mmol/L (ref 135–145)
Total Bilirubin: 0.2 mg/dL — ABNORMAL LOW (ref 0.3–1.2)
Total Protein: 8.4 g/dL — ABNORMAL HIGH (ref 6.5–8.1)

## 2017-06-06 LAB — RAPID URINE DRUG SCREEN, HOSP PERFORMED
Amphetamines: NOT DETECTED
BARBITURATES: NOT DETECTED
Benzodiazepines: NOT DETECTED
COCAINE: NOT DETECTED
Opiates: NOT DETECTED
Tetrahydrocannabinol: NOT DETECTED

## 2017-06-06 LAB — ETHANOL

## 2017-06-06 LAB — ACETAMINOPHEN LEVEL

## 2017-06-06 LAB — SALICYLATE LEVEL: Salicylate Lvl: <7 mg/dL (ref 2.8–30.0)

## 2017-06-06 LAB — TSH: TSH: 2.371 u[IU]/mL (ref 0.350–4.500)

## 2017-06-06 MED ORDER — TRAZODONE HCL 50 MG PO TABS: 50.0000 mg | ORAL_TABLET | Freq: Every day | ORAL | 0 refills | Status: DC

## 2017-06-06 MED ORDER — PROPRANOLOL HCL 10 MG PO TABS: 20.0000 mg | ORAL_TABLET | Freq: Once | ORAL | Status: DC

## 2017-06-06 MED ORDER — LORAZEPAM 1 MG PO TABS
1.0000 mg | ORAL_TABLET | Freq: Once | ORAL | Status: AC
Start: 2017-06-06 — End: 2017-06-06
  Administered 2017-06-06: 1 mg via ORAL
  Filled 2017-06-06: qty 1

## 2017-06-06 NOTE — ED Notes (Signed)
Pt awakened to speak with TTS

## 2017-06-06 NOTE — ED Notes (Signed)
HB in to eval  

## 2017-06-06 NOTE — ED Provider Notes (Signed)
[facility] EMERGENCY DEPARTMENT Provider Note   CSN: [id number] Arrival date & time: 06/06/17  0957     History   Chief Complaint Chief Complaint  Patient presents with  . Insomnia    HPI Teresa Sweeney is a 62 y.o. female.  Patient is a 62 year old female who presents to the emergency department with a complaint of inability to sleep.  The patient states for the past 3 weeks she has gotten very little if any sleep.  She states that her primary physician changed her medication.  Taking her off of Seroquel for a while.  This was then switched back, but since she has been switched back to the Seroquel she still is not resting.  Patient states she is also followed by behavioral health, and had an appointment this week, but could not make it.  She presents now for assistance with her rest.  Patient denies suicidal or homicidal ideations.  The patient denies audible or visible hallucinations.     Past Medical History:  Diagnosis Date  . Anxiety   . Depression   . Diabetes mellitus without complication ([place])   . Hyperlipemia   . Hypertension     Patient Active Problem List   Diagnosis Date Noted  . Diabetes ([place]) 04/15/2016  . Diabetic neuropathy ([place]) 04/15/2016  . HTN (hypertension) 04/12/2016  . Dyslipidemia 04/12/2016  . Substance or medication-induced bipolar and related disorder ([place]) 04/11/2016    Past Surgical History:  Procedure Laterality Date  . ABDOMINAL HYSTERECTOMY       OB History   None      Home Medications    Prior to Admission medications   Medication Sig Start Date End Date Taking? Authorizing Provider  aspirin EC 81 MG tablet Take 81 mg by mouth daily.   Yes [provider]  hydrochlorothiazide (HYDRODIURIL) 25 MG tablet Take 25 mg by mouth daily.   Yes [provider]  metFORMIN (GLUCOPHAGE) 500 MG tablet Take 1 tablet (500 mg total) by mouth 2 (two) times daily with a meal. 04/15/16  Yes Pucilowska, Jolanta B, MD    pravastatin (PRAVACHOL) 40 MG tablet Take 40 mg by mouth at bedtime.  09/22/15  Yes [provider]  QUEtiapine (SEROQUEL) 50 MG tablet Take 2 tablets (100 mg total) by mouth at bedtime. 04/15/16  Yes Pucilowska, Jolanta B, MD  traZODone (DESYREL) 50 MG tablet Take 1 tablet (50 mg total) by mouth at bedtime. 06/06/17   Lily Kocher, PA-C  triamterene-hydrochlorothiazide (DYAZIDE) 37.5-25 MG capsule Take 1 capsule by mouth daily. 09/22/15   [provider]    Family History Family History  Problem Relation Age of Onset  . Heart attack Mother   . Diabetes Sister   . Diabetes Other   . Hypertension Other   . Cancer Other     Social History Social History   Tobacco Use  . Smoking status: Former Smoker    Packs/day: 1.00    Types: Cigarettes  . Smokeless tobacco: Never Used  Substance Use Topics  . Alcohol use: No  . Drug use: No     Allergies   Patient has no known allergies.   Review of Systems Review of Systems  Constitutional: Negative for activity change.       All ROS Neg except as noted in HPI  HENT: Negative for nosebleeds.   Eyes: Negative for photophobia and discharge.  Respiratory: Negative for cough, shortness of breath and wheezing.   Cardiovascular: Negative for chest pain  and palpitations.  Gastrointestinal: Negative for abdominal pain and blood in stool.  Genitourinary: Negative for dysuria, frequency and hematuria.  Musculoskeletal: Negative for arthralgias, back pain and neck pain.  Skin: Negative.   Neurological: Negative for dizziness, seizures and speech difficulty.  Psychiatric/Behavioral: Negative for confusion and hallucinations. The patient is nervous/anxious.        Insomnia     Physical Exam Updated Vital Signs BP 114/75   Pulse 92   Temp 97.8 F (36.6 C) (Oral)   Resp (!) 21   Ht 5\' 1"  (1.549 m)   Wt 65.8 kg (145 lb)   SpO2 94%   BMI 27.40 kg/m   Physical Exam  Constitutional: She is oriented to person, place,  and time. She appears well-developed and well-nourished.  Non-toxic appearance.  HENT:  Head: Normocephalic.  Right Ear: Tympanic membrane and external ear normal.  Left Ear: Tympanic membrane and external ear normal.  Eyes: Pupils are equal, round, and reactive to light. EOM and lids are normal.  Neck: Normal range of motion. Neck supple. Carotid bruit is not present.  Cardiovascular: Regular rhythm, normal heart sounds, intact distal pulses and normal pulses. Tachycardia present.  Heart rate 100-103.  Pulmonary/Chest: Breath sounds normal. No respiratory distress.  Abdominal: Soft. Bowel sounds are normal. There is no tenderness. There is no guarding.  Musculoskeletal: Normal range of motion.  Lymphadenopathy:       Head (right side): No submandibular adenopathy present.       Head (left side): No submandibular adenopathy present.    She has no cervical adenopathy.  Neurological: She is alert and oriented to person, place, and time. She has normal strength. No cranial nerve deficit or sensory deficit.  Skin: Skin is warm and dry.  Psychiatric: Her speech is normal. Her mood appears anxious. Thought content is not paranoid. She expresses no homicidal and no suicidal ideation. She expresses no suicidal plans and no homicidal plans.  Nursing note and vitals reviewed.    ED Treatments / Results  Labs (all labs ordered are listed, but only abnormal results are displayed) Labs Reviewed  CBC WITH DIFFERENTIAL/PLATELET - Abnormal; Notable for the following components:      Result Value   MCH 25.3 (*)    All other components within normal limits  COMPREHENSIVE METABOLIC PANEL - Abnormal; Notable for the following components:   BUN 28 (*)    Creatinine, Ser 1.02 (*)    Total Protein 8.4 (*)    Total Bilirubin 0.2 (*)    GFR calc non Af Amer 57 (*)    Anion gap 16 (*)    All other components within normal limits  URINALYSIS, ROUTINE W REFLEX MICROSCOPIC - Abnormal; Notable for the  following components:   Hgb urine dipstick SMALL (*)    Leukocytes, UA MODERATE (*)    Bacteria, UA RARE (*)    Squamous Epithelial / LPF 0-5 (*)    All other components within normal limits  ACETAMINOPHEN LEVEL - Abnormal; Notable for the following components:   Acetaminophen (Tylenol), Serum <10 (*)    All other components within normal limits  URINE CULTURE  RAPID URINE DRUG SCREEN, HOSP PERFORMED  ETHANOL  SALICYLATE LEVEL  TSH    EKG EKG Interpretation  Date/Time:  Friday June 06 2017 12:17:35 EDT Ventricular Rate:  99 PR Interval:    QRS Duration: 75 QT Interval:  345 QTC Calculation: 443 R Axis:   9 Text Interpretation:  Sinus rhythm Low voltage, precordial leads  Borderline T abnormalities, anterior leads Artifact When compared with ECG of 04/12/2016 No significant change was found Confirmed by Francine Graven [phone]) on 06/06/2017 1:18:35 PM   Radiology No results found.  Procedures Procedures (including critical care time)  Medications Ordered in ED Medications  LORazepam (ATIVAN) tablet 1 mg (1 mg Oral Given 06/06/17 1204)     Initial Impression / Assessment and Plan / ED Course  I have reviewed the triage vital signs and the nursing notes.  Pertinent labs & imaging results that were available during my care of the patient were reviewed by me and considered in my medical decision making (see chart for details).     Final Clinical Impressions(s) / ED Diagnoses MDM  Blood pressure and pulse rate elevated.  Patient denies suicidal homicidal ideations.  She denies hallucinations audible or visible.  Patient somewhat anxious.  Will use Ativan.  Will check laboratory studies for possible medical clearance.  We will also obtain an act team evaluation.  Patient resting comfortably after Ativan.  Heart rate improving, blood pressure improving.  Patient resting.  Patient significant other states the patient has been sleep since the Ativan kicked in.  TSH is  within normal limits at 2.371.  Complete blood count is nonacute. Comprehensive metabolic panel shows the BUN to be elevated at 28, the creatinine to be slightly elevated at 1.02, the total bilirubin is low at 0.2, the anion gap is slightly elevated at 16.  Ethanol, salicylate, and acetaminophen are all less than therapeutic.  Urinalysis shows a moderate leukocyte esterase, and 6-30 white blood cells.  There is a small amount of blood on the dipstick.  There is 0-5 squamous cells present.  A culture of the urine will be sent to the lab.  There was a delay in obtaining the TTS consult.  The delay was explained to the patient.  TTS team suggested the patient be placed on trazodone.  The patient is to follow-up with the primary physician who is been regulating the sleeping medication.  The patient will return to the emergency department if any changes in condition, problems, or concerns.  Patient and her significant other in agreement with this plan.   Final diagnoses:  Primary insomnia    ED Discharge Orders        Ordered    traZODone (DESYREL) 50 MG tablet  Daily at bedtime     06/06/17 1607       Lily Kocher, PA-C 06/06/17 [place], [place], DO 06/07/17 [phone]

## 2017-06-06 NOTE — BH Assessment (Signed)
Tele Assessment Note   Patient Name: Teresa Sweeney MRN: [id number] Referring Physician: Randa Evens, PA Location of [facility]: [facility] Location of Provider: [place] is an 62 y.o. female presents to [facility] with brother. Pt reprots she has not been able to sleep for 3 weeks. She states she is sleeping only 1-2 hours a night. Pt denies SI currently or at any time in the past. Pt denies any history of suicide attempts and denies history of self-mutilation. Pt denies homicidal thoughts or physical aggression. Pt denies having access to firearms. Pt denies having any legal problems at this time. Pt denies any current or past substance abuse problems. Pt does not appear to be intoxicated or in withdrawal at this time. Pt denies hallucinations. Pt does not appear to be responding to internal stimuli and exhibits no delusional thought. Pt's reality testing appears to be intact.  Pt is retired and lives with her brother. Brother denies pt has been SI/HI or AH/VH. Pt has no inpatient or outpatient services.   Pt is dressed in street , alert, oriented x4 with normal speech and normal motor behavior. Eye contact is good and Pt is quiet and calm. Pt's mood is pleasant and affect is flat. Thought process is coherent and relevant. Pt's insight is good and judgement is unimpaired. There is no indication Pt is currently responding to internal stimuli or experiencing delusional thought content. Pt was cooperative throughout assessment.     Diagnosis: Imsomnia  Past Medical History:  Past Medical History:  Diagnosis Date  . Anxiety   . Depression   . Diabetes mellitus without complication ([place])   . Hyperlipemia   . Hypertension     Past Surgical History:  Procedure Laterality Date  . ABDOMINAL HYSTERECTOMY      Family History:  Family History  Problem Relation Age of Onset  . Heart attack Mother   . Diabetes Sister   . Diabetes Other   . Hypertension  Other   . Cancer Other     Social History:  reports that she has quit smoking. Her smoking use included cigarettes. She smoked 1.00 pack per day. She has never used smokeless tobacco. She reports that she does not drink alcohol or use drugs.  Additional Social History:  Alcohol / Drug Use Pain Medications: See PTA Prescriptions: See PTA Over the Counter: See PTA History of alcohol / drug use?: No history of alcohol / drug abuse  CIWA: CIWA-Ar BP: 114/75 Pulse Rate: 92 COWS:    Allergies: No Known Allergies  Home Medications:  (Not in a hospital admission)  OB/GYN Status:  No LMP recorded. Patient has had a hysterectomy.  General Assessment Data [facility] of Assessment: [facility] TTS Assessment: In system Is this a Tele or Face-to-Face Assessment?: Tele Assessment Is this an Initial Assessment or a Re-assessment for this encounter?: Initial Assessment Marital status: Single Is patient pregnant?: No Pregnancy Status: No Living Arrangements: Other relatives(Brother) Can pt return to current living arrangement?: Yes Admission Status: Voluntary Is patient capable of signing voluntary admission?: Yes Referral Source: Self/Family/Friend Insurance type: [place] Living Arrangements: Other relatives(Brother) Name of Psychiatrist: None Name of Therapist: None  Education Status Is patient currently in school?: No Is the patient employed, unemployed or receiving disability?: Unemployed  Risk to self with the past 6 months Suicidal Ideation: No Has patient been a risk to self within the past 6 months prior to admission? : No  Suicidal Intent: No Has patient had any suicidal intent within the past 6 months prior to admission? : No Is patient at risk for suicide?: No Suicidal Plan?: No Has patient had any suicidal plan within the past 6 months prior to admission? : No Access to Means: No What has been your use of drugs/alcohol within the last 12 months?:  None Previous Attempts/Gestures: No Other Self Harm Risks: None Intentional Self Injurious Behavior: None Family Suicide History: No Recent stressful life event(s): (None) Persecutory voices/beliefs?: No Depression: No Depression Symptoms: (None) Substance abuse history and/or treatment for substance abuse?: No Suicide prevention information given to non-admitted patients: Not applicable  Risk to Others within the past 6 months Homicidal Ideation: No Does patient have any lifetime risk of violence toward others beyond the six months prior to admission? : No Thoughts of Harm to Others: No Current Homicidal Intent: No Current Homicidal Plan: No Access to Homicidal Means: No History of harm to others?: No Assessment of Violence: None Noted Does patient have access to weapons?: No Criminal Charges Pending?: No Does patient have a court date: No Is patient on probation?: No  Psychosis Hallucinations: None noted Delusions: None noted  Mental Status Report Appearance/Hygiene: Unremarkable Eye Contact: Good Motor Activity: Freedom of movement Speech: Logical/coherent, Soft Level of Consciousness: Drowsy, Quiet/awake Mood: Pleasant Affect: Flat Anxiety Level: None Thought Processes: Coherent, Relevant Judgement: Unimpaired Orientation: Person, Place, Time, Situation, Appropriate for developmental age Obsessive Compulsive Thoughts/Behaviors: None  Cognitive Functioning Concentration: Normal Memory: Recent Intact Is patient IDD: No Is patient DD?: No Insight: Good Impulse Control: Good Appetite: Good Have you had any weight changes? : No Change Sleep: Decreased Total Hours of Sleep: 2 Vegetative Symptoms: None  ADLScreening [facility] Assessment Services) Patient's cognitive ability adequate to safely complete daily activities?: Yes Patient able to express need for assistance with ADLs?: Yes Independently performs ADLs?: Yes (appropriate for developmental age)  Prior  Inpatient Therapy Prior Inpatient Therapy: No  Prior Outpatient Therapy Prior Outpatient Therapy: No Does patient have an ACCT team?: No Does patient have Intensive In-House Services?  : No Does patient have [facility] services? : No Does patient have P4CC services?: No  ADL Screening (condition at time of admission) Patient's cognitive ability adequate to safely complete daily activities?: Yes Is the patient deaf or have difficulty hearing?: No Does the patient have difficulty seeing, even when wearing glasses/contacts?: No Does the patient have difficulty concentrating, remembering, or making decisions?: No Patient able to express need for assistance with ADLs?: Yes Does the patient have difficulty dressing or bathing?: No Independently performs ADLs?: Yes (appropriate for developmental age) Does the patient have difficulty walking or climbing stairs?: No Weakness of Legs: None Weakness of Arms/Hands: None     Therapy Consults (therapy consults require a physician order) PT Evaluation Needed: No OT Evalulation Needed: No SLP Evaluation Needed: No Abuse/Neglect Assessment (Assessment to be complete while patient is alone) Abuse/Neglect Assessment Can Be Completed: Yes Physical Abuse: Denies Verbal Abuse: Denies Sexual Abuse: Denies Exploitation of patient/patient's resources: Denies Self-Neglect: Denies Values / Beliefs Cultural Requests During Hospitalization: None Spiritual Requests During Hospitalization: None Consults Spiritual Care Consult Needed: No Social Work Consult Needed: No Regulatory affairs officer (For Healthcare) Does Patient Have a Medical Advance Directive?: No Would patient like information on creating a medical advance directive?: No - Patient declined    Additional Information 1:1 In Past 12 Months?: No CIRT Risk: No Elopement Risk: No Does patient have medical clearance?: Yes     Disposition:  Disposition Initial Assessment Completed for this  Encounter: Yes Disposition of Patient: Discharge   Per Shuvon Rankin NP pt does not meet inpatient criteria.   This service was provided via telemedicine using a 2-way, interactive audio and video technology.  Names of all persons participating in this telemedicine service and their role in this encounter. Name: Brayton El Role: Pt  Name: Steffanie Rainwater, [place], [place] Role: Therapeutic Triage Specialist  Name: Earleen Newport, NP Role: NP  Name: Harrold Donath Role: Brother    Steffanie Rainwater, [place], LPCA 06/06/2017 4:10 PM

## 2017-06-06 NOTE — ED Notes (Signed)
Pt sleeping awaitig TTS  "5 people in front of her"

## 2017-06-06 NOTE — Consult Note (Signed)
  Tele Assessment    Teresa Sweeney, 62 y.o., female patient presented to [facility] with complaints that she has only been able to sleep for one hour a night for the last 3 weeks.  Patient seen via telepsych by TTS and this provider; chart reviewed and consulted with Dr. Dwyane Dee on 06/06/17.  On evaluation Teresa Sweeney reports that she can to the hospital because she was unable to sleep.  Reports for the last 3 weeks she is only slept for 1 hour a night.  Patient denies taking any naps during the day.  Reports that she is eating without any difficulty.  Patient denies suicidal/self-harm/homicidal ideation, psychosis, and paranoia.  When asked again if she was sleeping any during the day or on and off at night patient replied "I get tired about 6 AM and then fall asleep but I wake up about 8:30 AM."  Patient reports that she lives with her brother who she is living with for 47 years.  Patient has another brother by her bedside who states that he has not noticed any behavior of patient and he does not feel that she is suicidal and is safe to go home and that they were just looking to get some medication to help her sleep.  Assessment patient is laying on bed, casually dressed neat and appropriate for the weather; she is alert/oriented x3; calm/cooperative; and mood is congruent with affect.  Patient denied suicidal/self-harm/homicidal ideations, psychosis, and paranoia.  Patient does not appear to be responding to internal/external stimuli or delusional thoughts.  Patient responded appropriately to questions that were asked.  Patient psychiatrically cleared instructed to follow-up with her primary care physician related to her insomnia.  Recommendations: Patient psychiatrically cleared.  Patient will need to follow-up with primary care physician related to insomnia.  Will ask [facility] if he can prescribe trazodone 50 mg nightly as needed insomnia enough until patient can see her primary care physician  Monday.   Spoke with Teresa Kocher, PA and informed of above recommendations for trazodone 50 mg nightly as needed insomnia.  Teresa Newport, NP

## 2017-06-06 NOTE — ED Notes (Addendum)
[facility] has evaluated, awaiting eval  by NP, [facility]

## 2017-06-06 NOTE — ED Notes (Signed)
Call to [facility] to ascertain when pt will be evaluated  She is now third to be seen with two people in from of her

## 2017-06-06 NOTE — Discharge Instructions (Addendum)
Please refrain from caffeine as much as possible.  Please use trazodone at bedtime.  Please schedule an appointment with your primary physician at [facility] family [facility] to review your medications and also your difficulty with sleeping.  Return to the emergency department if any changes in your condition, problems, or concerns.

## 2017-06-06 NOTE — ED Notes (Addendum)
Per RN reports, followed by [facility], rx'd with seroquel Difficulty sleeping x 3 weeks, given meds to assist with sleep Here for eval   Per pt  PCP took her off of her HTN and sleeping meds She has not slept since even though she has been given several meds for sleeping since including her original sleeping med

## 2017-06-06 NOTE — ED Notes (Signed)
Resting w eyes closed   Awaiting [facility] eval

## 2017-06-06 NOTE — ED Notes (Signed)
HB in to reassess and discuss findings

## 2017-06-06 NOTE — ED Notes (Signed)
HB in to assess, discuss findings,  Pt and family member aware that delay is TTS is busy at this time  And pt is awaiting her turn for TTS

## 2017-06-06 NOTE — ED Triage Notes (Signed)
Pt has been unable to sleep for the last 3 weeks.  Saw PCP and was given meds with no relief.

## 2017-06-08 LAB — URINE CULTURE

## 2017-06-14 ENCOUNTER — Other Ambulatory Visit: Payer: Self-pay

## 2017-06-14 ENCOUNTER — Emergency Department (HOSPITAL_COMMUNITY)
Admission: EM | Admit: 2017-06-14 | Discharge: 2017-06-14 | Disposition: A | Discharge status: Home / Self Care | Payer: BLUE CROSS/BLUE SHIELD | Attending: Emergency Medicine | Admitting: Emergency Medicine

## 2017-06-14 ENCOUNTER — Encounter (HOSPITAL_COMMUNITY): Payer: Self-pay | Admitting: Emergency Medicine

## 2017-06-14 DIAGNOSIS — E785 Hyperlipidemia, unspecified: Secondary | ICD-10-CM | POA: Diagnosis not present

## 2017-06-14 DIAGNOSIS — E1140 Type 2 diabetes mellitus with diabetic neuropathy, unspecified: Secondary | ICD-10-CM | POA: Insufficient documentation

## 2017-06-14 DIAGNOSIS — Z79899 Other long term (current) drug therapy: Secondary | ICD-10-CM | POA: Insufficient documentation

## 2017-06-14 DIAGNOSIS — Z7982 Long term (current) use of aspirin: Secondary | ICD-10-CM | POA: Insufficient documentation

## 2017-06-14 DIAGNOSIS — Z87891 Personal history of nicotine dependence: Secondary | ICD-10-CM | POA: Insufficient documentation

## 2017-06-14 DIAGNOSIS — I10 Essential (primary) hypertension: Secondary | ICD-10-CM | POA: Insufficient documentation

## 2017-06-14 DIAGNOSIS — Z7984 Long term (current) use of oral hypoglycemic drugs: Secondary | ICD-10-CM | POA: Insufficient documentation

## 2017-06-14 DIAGNOSIS — E876 Hypokalemia: Secondary | ICD-10-CM | POA: Insufficient documentation

## 2017-06-14 DIAGNOSIS — G4700 Insomnia, unspecified: Secondary | ICD-10-CM | POA: Insufficient documentation

## 2017-06-14 LAB — CBC WITH DIFFERENTIAL/PLATELET
Basophils Absolute: 0 10*3/uL (ref 0.0–0.1)
Basophils Relative: 1 %
Eosinophils Absolute: 0.1 10*3/uL (ref 0.0–0.7)
Eosinophils Relative: 1 %
HEMATOCRIT: 39.5 % (ref 36.0–46.0)
Hemoglobin: 12.5 g/dL (ref 12.0–15.0)
LYMPHS PCT: 38 %
Lymphs Abs: 2.2 10*3/uL (ref 0.7–4.0)
MCH: 25.6 pg — AB (ref 26.0–34.0)
MCHC: 31.6 g/dL (ref 30.0–36.0)
MCV: 80.9 fL (ref 78.0–100.0)
MONO ABS: 0.4 10*3/uL (ref 0.1–1.0)
MONOS PCT: 7 %
NEUTROS ABS: 3.2 10*3/uL (ref 1.7–7.7)
Neutrophils Relative %: 53 %
Platelets: 270 10*3/uL (ref 150–400)
RBC: 4.88 MIL/uL (ref 3.87–5.11)
RDW: 14.9 % (ref 11.5–15.5)
WBC: 5.9 10*3/uL (ref 4.0–10.5)

## 2017-06-14 LAB — BASIC METABOLIC PANEL
ANION GAP: 14 (ref 5–15)
BUN: 27 mg/dL — ABNORMAL HIGH (ref 6–20)
CALCIUM: 9.7 mg/dL (ref 8.9–10.3)
CO2: 23 mmol/L (ref 22–32)
Chloride: 100 mmol/L — ABNORMAL LOW (ref 101–111)
Creatinine, Ser: 1.02 mg/dL — ABNORMAL HIGH (ref 0.44–1.00)
GFR calc Af Amer: >60 mL/min (ref >60)
GFR calc non Af Amer: 57 mL/min — ABNORMAL LOW (ref >60)
GLUCOSE: 135 mg/dL — AB (ref 65–99)
POTASSIUM: 2.9 mmol/L — AB (ref 3.5–5.1)
Sodium: 137 mmol/L (ref 135–145)

## 2017-06-14 MED ORDER — LORAZEPAM 1 MG PO TABS
1.0000 mg | ORAL_TABLET | Freq: Once | ORAL | Status: AC
  Administered 2017-06-14: 1 mg via ORAL
  Filled 2017-06-14: qty 1

## 2017-06-14 MED ORDER — POTASSIUM CHLORIDE CRYS ER 20 MEQ PO TBCR
40.0000 meq | EXTENDED_RELEASE_TABLET | Freq: Once | ORAL | Status: AC
  Administered 2017-06-14: 40 meq via ORAL
  Filled 2017-06-14: qty 2

## 2017-06-14 MED ORDER — ZOLPIDEM TARTRATE 5 MG PO TABS: 5.0000 mg | ORAL_TABLET | Freq: Every evening | ORAL | 0 refills | Status: AC | PRN

## 2017-06-14 MED ORDER — POTASSIUM CHLORIDE ER 10 MEQ PO TBCR: 10.0000 meq | EXTENDED_RELEASE_TABLET | Freq: Two times a day (BID) | ORAL | 0 refills | Status: DC

## 2017-06-14 NOTE — Consult Note (Signed)
  Tele Assessment   Teresa Sweeney, 62 y.o., female patient presented to [facility] with complaints of insomnia.  Patient seen via telepsych by TTS and  this provider; chart reviewed and consulted with Dr. Dwyane Dee on 06/14/17.  On evaluation Teresa Sweeney reports that she is still not sleeping; that she has seen her PCP and was started on Lexapro.  States that the Trazodone that was prescribed at last ED visit doesn't work and that she has taken up to 100 to 200 mg at bed time and did not work.  Patient was also prescribed Seroquel prior that she also stopped because it did not work.  Patient states that she has been referred to a neurologist for sleep study but hasn't gotten appointment yet. Patient denies suicidal/self harm/homicidal ideation, psychosis, and paranoia. Patient psychiatrically cleared.   Patient need to follow up with PCP and see if Remeron or Ambien will help with sleep  For Detailed Note See TTS Tele Assessment Note    Shuvon B. Rankin, NP

## 2017-06-14 NOTE — ED Notes (Addendum)
Pt with flat affect, tearful at times. Just states I cant sleep. Become very anxious and shaking when this nurse left room after obtaining blood specimen Notified EDP

## 2017-06-14 NOTE — ED Notes (Signed)
TTS in progress 

## 2017-06-14 NOTE — BH Assessment (Addendum)
Tele Assessment Note   Patient Name: Teresa Sweeney MRN: [id number] Referring Physician: PIckering MD Location of Patient: [facility] Location of Provider: [facility]  Teresa Sweeney is an 62 y.o. female. Patient presents voluntarily to [facility] ED with chief complaint of insomnia. Per chart review, pt presented to [facility] on 06/06/17 with same complaint. Patient's affect is flat. She is oriented to self, time, place and situation. She reports having insomnia for the past 4 weeks. She says her insomnia began when her PCP changed her blood pressure and cholesterol meds. Pt says the PCP then placed her back on her previous meds the next week once insomnia was discovered. Pt says she still can't sleep. Pt denies napping during the day. She reports poor appetite with weight loss. When asked about her mood, pt says, "I guess a little rugged because I can't sleep." She reports she is no longer taking Trazodone for sleep as she felt it didn't help her. She says she has been taking Escitalopram 5 mg for past 3 days and it is prescribed by her psychiatric NP Kelby Fam at [place]. Patient says that none of the meds prescribed to her for sleep have helped her. Per chart review, pt was admitted to [facility] Feb 2018 for suicidal ideation and depression. Pt says she thinks she needs to participate in a sleep study. Writer discussed with pt that she needs to speak with her PCP in case she needs a referral for a sleep study. Pt denies SI currently. Pt denies any history of suicide attempts and denies history of self-mutilation. Pt denies homicidal thoughts or physical aggression. Pt denies having access to firearms. Pt denies having any legal problems at this time. Pt denies hallucinations. Pt does not appear to be responding to internal stimuli and exhibits no delusional thought. Pt's reality testing appears to be intact. Pt denies any current or past substance abuse  problems. Pt does not appear to be intoxicated or in withdrawal at this time.  Diagnosis: Major Depressive Disorder, Moderate  Past Medical History:  Past Medical History:  Diagnosis Date  . Anxiety   . Depression   . Diabetes mellitus without complication ([place])   . Hyperlipemia   . Hypertension     Past Surgical History:  Procedure Laterality Date  . ABDOMINAL HYSTERECTOMY      Family History:  Family History  Problem Relation Age of Onset  . Heart attack Mother   . Diabetes Sister   . Diabetes Other   . Hypertension Other   . Cancer Other     Social History:  reports that she has quit smoking. Her smoking use included cigarettes. She smoked 1.00 pack per day. She has never used smokeless tobacco. She reports that she does not drink alcohol or use drugs.  Additional Social History:  Alcohol / Drug Use Pain Medications: pt denies abuse - see pta meds list Prescriptions: pt denies abuse - see pta meds list Over the Counter: pt denies abuse - see pta meds list History of alcohol / drug use?: No history of alcohol / drug abuse Longest period of sobriety (when/how long): n/a  CIWA: CIWA-Ar BP: (!) 112/95 Pulse Rate: 92 COWS:    Allergies:  Allergies  Allergen Reactions  . Lyrica [Pregabalin]     Made her afraid of the dark    Home Medications:  (Not in a hospital admission)  OB/GYN Status:  No LMP recorded. Patient has had a hysterectomy.  General  Assessment Data [facility] of Assessment: [facility] TTS Assessment: In system Is this a Tele or Face-to-Face Assessment?: Tele Assessment Is this an Initial Assessment or a Re-assessment for this encounter?: Initial Assessment Marital status: Single Maiden name: Teresa Sweeney Is patient pregnant?: No Pregnancy Status: No Living Arrangements: Other relatives(brother) Can pt return to current living arrangement?: Yes Admission Status: Voluntary Is patient capable of signing voluntary admission?: Yes Referral  Source: Self/Family/Friend Insurance type: [employer]     Crisis Care Plan Living Arrangements: Other relatives(brother) Name of Psychiatrist: none Name of Therapist: none  Education Status Is patient currently in school?: No Is the patient employed, unemployed or receiving disability?: Unemployed  Risk to self with the past 6 months Suicidal Ideation: No Has patient been a risk to self within the past 6 months prior to admission? : No Suicidal Intent: No Has patient had any suicidal intent within the past 6 months prior to admission? : No Is patient at risk for suicide?: No Suicidal Plan?: No Has patient had any suicidal plan within the past 6 months prior to admission? : No Access to Means: No What has been your use of drugs/alcohol within the last 12 months?: none Previous Attempts/Gestures: No How many times?: 0 Other Self Harm Risks: none Triggers for Past Attempts: (n/a) Intentional Self Injurious Behavior: None Family Suicide History: No Recent stressful life event(s): Other (Comment)(insomnia) Persecutory voices/beliefs?: No Depression: No Depression Symptoms: Insomnia Substance abuse history and/or treatment for substance abuse?: No Suicide prevention information given to non-admitted patients: Not applicable  Risk to Others within the past 6 months Homicidal Ideation: No Does patient have any lifetime risk of violence toward others beyond the six months prior to admission? : No Thoughts of Harm to Others: No Current Homicidal Intent: No Current Homicidal Plan: No Access to Homicidal Means: No Identified Victim: none History of harm to others?: No Assessment of Violence: None Noted Violent Behavior Description: pt denies history of violence Does patient have access to weapons?: No Criminal Charges Pending?: No Does patient have a court date: No Is patient on probation?: No  Psychosis Hallucinations: None noted Delusions: None noted  Mental  Status Report Appearance/Hygiene: Unremarkable Eye Contact: Good Motor Activity: Freedom of movement, Unremarkable Speech: Logical/coherent, Soft Level of Consciousness: Quiet/awake, Alert Mood: Pleasant Affect: Blunted Anxiety Level: None Thought Processes: Coherent, Relevant Judgement: Unimpaired Orientation: Person, Place, Situation, Time Obsessive Compulsive Thoughts/Behaviors: None  Cognitive Functioning Concentration: Normal Memory: Recent Intact, Remote Intact Is patient IDD: No Is patient DD?: No Insight: Poor Impulse Control: Fair Appetite: Poor Have you had any weight changes? : Loss Amount of the weight change? (lbs): 4 lbs(in one week) Sleep: Decreased Total Hours of Sleep: 4 Vegetative Symptoms: None  ADLScreening [facility] Assessment Services) Patient's cognitive ability adequate to safely complete daily activities?: Yes Patient able to express need for assistance with ADLs?: Yes Independently performs ADLs?: Yes (appropriate for developmental age)  Prior Inpatient Therapy Prior Inpatient Therapy: Yes Prior Therapy Dates: Feb 2018 Prior Therapy Facilty/Provider(s): [facility] Reason for Treatment: suicidal ideation, depression  Prior Outpatient Therapy Prior Outpatient Therapy: No Does patient have an ACCT team?: No Does patient have Intensive In-House Services?  : No Does patient have [facility] services? : No Does patient have P4CC services?: No  ADL Screening (condition at time of admission) Patient's cognitive ability adequate to safely complete daily activities?: Yes Is the patient deaf or have difficulty hearing?: No Does the patient have difficulty seeing, even when wearing  glasses/contacts?: No Does the patient have difficulty concentrating, remembering, or making decisions?: No Patient able to express need for assistance with ADLs?: Yes Does the patient have difficulty dressing or bathing?: No Independently performs ADLs?: Yes  (appropriate for developmental age) Does the patient have difficulty walking or climbing stairs?: No Weakness of Legs: None Weakness of Arms/Hands: None  Home Assistive Devices/Equipment Home Assistive Devices/Equipment: Eyeglasses    Abuse/Neglect Assessment (Assessment to be complete while patient is alone) Abuse/Neglect Assessment Can Be Completed: Yes Physical Abuse: Denies Verbal Abuse: Denies Sexual Abuse: Denies Exploitation of patient/patient's resources: Denies Self-Neglect: Denies     Regulatory affairs officer (For Healthcare) Does Patient Have a Medical Advance Directive?: No Would patient like information on creating a medical advance directive?: No - Patient declined    Additional Information 1:1 In Past 12 Months?: No CIRT Risk: No Elopement Risk: No Does patient have medical clearance?: Yes     Disposition:  Disposition Initial Assessment Completed for this Encounter: Yes Disposition of Patient: Discharge(shuvon rankin np recommends discharge;follow up with psychi )  This service was provided via telemedicine using a 2-way, interactive audio and video technology.  Names of all persons participating in this telemedicine service and their role in this encounter. Name: Brayton El patient  Earleen Newport NP TTS provider  [place] counselor       Leron Croak P 06/14/2017 11:35 AM

## 2017-06-14 NOTE — ED Triage Notes (Signed)
Patient states that she has been unable to sleep or eat x 4 weeks. States she has no appetite but has been able to drink fluids. No alleviating or exacerbating symptoms.

## 2017-06-14 NOTE — ED Provider Notes (Addendum)
[facility] EMERGENCY DEPARTMENT Provider Note   CSN: [id number] Arrival date & time: 06/14/17  5784     History   Chief Complaint Chief Complaint  Patient presents with  . Insomnia    HPI Teresa Sweeney is a 62 y.o. female.  HPI Patient presents with insomnia.  Has been unable to sleep for the last 4 weeks per the patient.  Has been on Seroquel previously for bipolar disorder.  Seen in the ER about a week ago for the same.  Seen by psychiatry and started on nightly trazodone.  States this is not helped.  Is followed with a primary care doctor and was just started on Lexapro 2 days ago.  States she does not sleep at all at night.  States she also has had no appetite and has not been eating or drinking.  She is diabetic.  States she checked her sugar yesterday and it is fine but did not check it today.  Denies substance abuse.  Denies suicidal homicidal thoughts.  Patient's family member asks if she can be admitted to the hospital.  Patient has had some weight loss but really cannot quantify it.  Patient had been on Seroquel then it was reportedly stopped.  He started back up and states it is not working. Past Medical History:  Diagnosis Date  . Anxiety   . Depression   . Diabetes mellitus without complication ([place])   . Hyperlipemia   . Hypertension     Patient Active Problem List   Diagnosis Date Noted  . Diabetes ([place]) 04/15/2016  . Diabetic neuropathy ([place]) 04/15/2016  . HTN (hypertension) 04/12/2016  . Dyslipidemia 04/12/2016  . 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 ABDOMINAL HYSTERECTOMY       OB History   None      Home Medications    Prior to Admission medications   Medication Sig Start Date End Date Taking? Authorizing Provider  aspirin EC 81 MG tablet Take 81 mg by mouth daily.   Yes [provider]  escitalopram (LEXAPRO) 5 MG tablet Take 1  tablet by mouth daily. 06/12/17  Yes [provider]  hydrochlorothiazide (HYDRODIURIL) 25 MG tablet Take 25 mg by mouth daily.   Yes [provider]  metFORMIN (GLUCOPHAGE) 500 MG tablet Take 1 tablet (500 mg total) by mouth 2 (two) times daily with a meal. 04/15/16  Yes Pucilowska, Jolanta B, MD  pravastatin (PRAVACHOL) 40 MG tablet Take 40 mg by mouth at bedtime.  09/22/15  Yes [provider]  potassium chloride (K-DUR) 10 MEQ tablet Take 1 tablet (10 mEq total) by mouth 2 (two) times daily. 06/14/17   Davonna Belling, MD  QUEtiapine (SEROQUEL) 50 MG tablet Take 2 tablets (100 mg total) by mouth at bedtime. 04/15/16   Pucilowska, Herma Ard B, MD  traZODone (DESYREL) 50 MG tablet Take 1 tablet (50 mg total) by mouth at bedtime. 06/06/17   Lily Kocher, PA-C  zolpidem (AMBIEN) 5 MG tablet Take 1 tablet (5 mg total) by mouth at bedtime as needed for sleep. 06/14/17   Davonna Belling, MD    Family History Family History  Problem Relation Age of Onset  . Heart attack Mother   . Diabetes Sister   . Diabetes Other   . Hypertension Other   . Cancer Other     Social History Social History   Tobacco Use  . Smoking status: Former Smoker  Packs/day: 1.00    Types: Cigarettes  . Smokeless tobacco: Never Used  Substance Use Topics  . Alcohol use: No  . Drug use: No     Allergies   Lyrica [pregabalin]   Review of Systems Review of Systems  Constitutional: Positive for appetite change and unexpected weight change.  HENT: Negative for congestion.   Respiratory: Negative for shortness of breath.   Cardiovascular: Negative for chest pain.  Gastrointestinal: Negative for abdominal pain.  Endocrine: Negative for polyuria.  Genitourinary: Negative for flank pain.  Musculoskeletal: Negative for back pain.  Skin: Negative for rash.  Neurological: Negative for weakness.       Insomnia  Psychiatric/Behavioral: Negative for suicidal ideas.       Insomnia      Physical Exam Updated Vital Signs BP (!) 112/95   Pulse 92   Temp 98.2 F (36.8 C) (Oral)   Resp 18   Wt 64 kg (141 lb)   SpO2 96%   BMI 26.64 kg/m   Physical Exam  Constitutional: She appears well-developed.  HENT:  Head: Normocephalic.  Eyes: EOM are normal.  Neck: Neck supple.  Cardiovascular:  Mild tachycardia  Pulmonary/Chest: Effort normal.  Abdominal: Soft.  Neurological: She is alert.  Skin: Skin is warm. Capillary refill takes less than 2 seconds.  Psychiatric:  Somewhat flat affect.  She seems to avoid eye contact  Does not appear to be responding to internal stimuli..     ED Treatments / Results  Labs (all labs ordered are listed, but only abnormal results are displayed) Labs Reviewed  BASIC METABOLIC PANEL - Abnormal; Notable for the following components:      Result Value   Potassium 2.9 (*)    Chloride 100 (*)    Glucose, Bld 135 (*)    BUN 27 (*)    Creatinine, Ser 1.02 (*)    GFR calc non Af Amer 57 (*)    All other components within normal limits  CBC WITH DIFFERENTIAL/PLATELET - Abnormal; Notable for the following components:   MCH 25.6 (*)    All other components within normal limits    EKG None  Radiology No results found.  Procedures Procedures (including critical care time)  Medications Ordered in ED Medications  LORazepam (ATIVAN) tablet 1 mg (1 mg Oral Given 06/14/17 0909)  potassium chloride SA (K-DUR,KLOR-CON) CR tablet 40 mEq (40 mEq Oral Given 06/14/17 0946)     Initial Impression / Assessment and Plan / ED Course  I have reviewed the triage vital signs and the nursing notes.  Pertinent labs & imaging results that were available during my care of the patient were reviewed by me and considered in my medical decision making (see chart for details).    Patient had what appeared to be an anxiety attack while in the ER.  Given Ativan.  Patient with insomnia.  Mild hypokalemia.  Orally supplemented.  Insomnia  reportedly has not gotten better on trazodone and recently started on Lexapro by PCP.  Does have some underlying psych problems that could be the cause.  Will have patient seen by TTS.   Psychiatry is seen patient and cleared for discharge to follow-up with her own psychiatry provider.  They recommend starting either Remeron or Ambien.  I have started Ambien for her.  Also has hypokalemia and will orally supplement.  Discharge home. Final Clinical Impressions(s) / ED Diagnoses   Final diagnoses:  Insomnia, unspecified type  Hypokalemia    ED Discharge Orders  Ordered    potassium chloride (K-DUR) 10 MEQ tablet  2 times daily     06/14/17 1255    zolpidem (AMBIEN) 5 MG tablet  At bedtime PRN     06/14/17 1255       Davonna Belling, MD 06/14/17 1008    Davonna Belling, MD 06/14/17 1306

## 2019-01-12 ENCOUNTER — Encounter (HOSPITAL_COMMUNITY): Payer: Self-pay | Admitting: *Deleted

## 2019-01-12 ENCOUNTER — Emergency Department (HOSPITAL_COMMUNITY)
Admission: EM | Admit: 2019-01-12 | Discharge: 2019-01-12 | Disposition: A | Discharge status: Home / Self Care | Payer: BLUE CROSS/BLUE SHIELD | Attending: Emergency Medicine | Admitting: Emergency Medicine

## 2019-01-12 DIAGNOSIS — E119 Type 2 diabetes mellitus without complications: Secondary | ICD-10-CM | POA: Diagnosis not present

## 2019-01-12 DIAGNOSIS — R519 Headache, unspecified: Secondary | ICD-10-CM | POA: Diagnosis not present

## 2019-01-12 DIAGNOSIS — Z7984 Long term (current) use of oral hypoglycemic drugs: Secondary | ICD-10-CM | POA: Insufficient documentation

## 2019-01-12 DIAGNOSIS — Z79899 Other long term (current) drug therapy: Secondary | ICD-10-CM | POA: Diagnosis not present

## 2019-01-12 DIAGNOSIS — I10 Essential (primary) hypertension: Secondary | ICD-10-CM | POA: Diagnosis not present

## 2019-01-12 DIAGNOSIS — Z7982 Long term (current) use of aspirin: Secondary | ICD-10-CM | POA: Insufficient documentation

## 2019-01-12 DIAGNOSIS — Z87891 Personal history of nicotine dependence: Secondary | ICD-10-CM | POA: Diagnosis not present

## 2019-01-12 LAB — CBG MONITORING, ED: Glucose-Capillary: 96 mg/dL (ref 70–99)

## 2019-01-12 MED ORDER — ACETAMINOPHEN ER 650 MG PO TBCR: 650.0000 mg | EXTENDED_RELEASE_TABLET | Freq: Three times a day (TID) | ORAL | 0 refills | Status: DC

## 2019-01-12 MED ORDER — METOCLOPRAMIDE HCL 5 MG/ML IJ SOLN
10.0000 mg | Freq: Once | INTRAMUSCULAR | Status: AC
  Administered 2019-01-12: 10 mg via INTRAVENOUS
  Filled 2019-01-12: qty 2

## 2019-01-12 MED ORDER — KETOROLAC TROMETHAMINE 15 MG/ML IJ SOLN
15.0000 mg | Freq: Once | INTRAMUSCULAR | Status: AC
  Administered 2019-01-12: 15 mg via INTRAVENOUS
  Filled 2019-01-12: qty 1

## 2019-01-12 NOTE — ED Triage Notes (Signed)
C/o headache onset today, denies nausea or vomiting

## 2019-01-12 NOTE — Discharge Instructions (Addendum)
We saw in the ER for the headaches. As discussed, you will need to follow-up with primary care doctor if your headaches persist.  We did not CT scan your head in the ER at your request.  Please refrain from smoking and consider calling 1-800-quit now for further information and resources on smoking cessation.

## 2019-01-12 NOTE — ED Provider Notes (Signed)
[facility] EMERGENCY DEPARTMENT Provider Note   CSN: [id number] Arrival date & time: 01/12/19  1215     History   Chief Complaint Chief Complaint  Patient presents with  . Headache    HPI Teresa Sweeney is a 63 y.o. female.     HPI  63 year old female comes in a chief complaint of a headache. She has history of diabetes, hypertension, hyperlipidemia.  She does not have known history of headache but reports that about 2 weeks ago she started noticing headaches.  The headaches are intermittent, always evoked when she smokes.  This current episode has been going on for 2 days and there is no specific evoking, aggravating or relieving factors.  Patient has no nausea, vomiting, fevers, chills.  She denies any new focal numbness, weakness, vision change, dizziness, ataxia.  Patient denies any trauma and she is not on any blood thinners.  There is no family history of brain aneurysm, brain tumor, brain bleed. Patient has not taken any pain meds.  Past Medical History:  Diagnosis Date  . Anxiety   . Depression   . Diabetes mellitus without complication ([place])   . Hyperlipemia   . Hypertension     Patient Active Problem List   Diagnosis Date Noted  . Diabetes ([place]) 04/15/2016  . Diabetic neuropathy ([place]) 04/15/2016  . HTN (hypertension) 04/12/2016  . Dyslipidemia 04/12/2016  . 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 ABDOMINAL HYSTERECTOMY       OB History   No obstetric history on file.      Home Medications    Prior to Admission medications   Medication Sig Start Date End Date Taking? Authorizing Provider  acetaminophen (TYLENOL 8 HOUR) 650 MG CR tablet Take 1 tablet (650 mg total) by mouth every 8 (eight) hours. 01/12/19   Varney Biles, MD  aspirin EC 81 MG tablet Take 81 mg by mouth daily.    [provider]  escitalopram (LEXAPRO) 5 MG tablet Take 1  tablet by mouth daily. 06/12/17   [provider]  hydrochlorothiazide (HYDRODIURIL) 25 MG tablet Take 25 mg by mouth daily.    [provider]  metFORMIN (GLUCOPHAGE) 500 MG tablet Take 1 tablet (500 mg total) by mouth 2 (two) times daily with a meal. 04/15/16   Pucilowska, Jolanta B, MD  potassium chloride (K-DUR) 10 MEQ tablet Take 1 tablet (10 mEq total) by mouth 2 (two) times daily. 06/14/17   Davonna Belling, MD  pravastatin (PRAVACHOL) 40 MG tablet Take 40 mg by mouth at bedtime.  09/22/15   [provider]  QUEtiapine (SEROQUEL) 50 MG tablet Take 2 tablets (100 mg total) by mouth at bedtime. 04/15/16   Pucilowska, Herma Ard B, MD  traZODone (DESYREL) 50 MG tablet Take 1 tablet (50 mg total) by mouth at bedtime. 06/06/17   Lily Kocher, PA-C  zolpidem (AMBIEN) 5 MG tablet Take 1 tablet (5 mg total) by mouth at bedtime as needed for sleep. 06/14/17   Davonna Belling, MD    Family History Family History  Problem Relation Age of Onset  . Heart attack Mother   . Diabetes Sister   . Diabetes Other   . Hypertension Other   . Cancer Other     Social History Social History   Tobacco Use  . Smoking status: Former Smoker    Packs/day: 1.00    Types: Cigarettes  . Smokeless tobacco: Never Used  Substance Use Topics  . Alcohol use: No  . Drug use: No     Allergies   Lyrica [pregabalin]   Review of Systems Review of Systems  Constitutional: Positive for activity change.  Eyes: Negative for photophobia and visual disturbance.  Respiratory: Negative for shortness of breath.   Cardiovascular: Negative for chest pain.  Gastrointestinal: Negative for nausea and vomiting.  Neurological: Positive for headaches. Negative for dizziness, seizures, syncope, speech difficulty, weakness, light-headedness and numbness.     Physical Exam Updated Vital Signs BP (!) 154/102   Pulse (!) 101   Temp 98.7 F (37.1 C)   Resp 20   Ht 5\' 1"  (1.549 m)   Wt 72.6 kg    SpO2 100%   BMI 30.23 kg/m   Physical Exam Vitals signs and nursing note reviewed.  Constitutional:      Appearance: She is well-developed.  HENT:     Head: Normocephalic and atraumatic.  Eyes:     Extraocular Movements: Extraocular movements intact.     Pupils: Pupils are equal, round, and reactive to light.     Comments: No nystagmus  Neck:     Musculoskeletal: Normal range of motion and neck supple.  Cardiovascular:     Rate and Rhythm: Normal rate.  Pulmonary:     Effort: Pulmonary effort is normal.  Abdominal:     General: Bowel sounds are normal.  Skin:    General: Skin is warm and dry.  Neurological:     Mental Status: She is alert and oriented to person, place, and time.     GCS: GCS eye subscore is 4. GCS verbal subscore is 5. GCS motor subscore is 6.     Cranial Nerves: No cranial nerve deficit.     Sensory: No sensory deficit.     Motor: No weakness.     Gait: Gait normal.      ED Treatments / Results  Labs (all labs ordered are listed, but only abnormal results are displayed) Labs Reviewed  CBG MONITORING, ED    EKG None  Radiology No results found.  Procedures Procedures (including critical care time)  Medications Ordered in ED Medications  ketorolac (TORADOL) 15 MG/ML injection 15 mg (15 mg Intravenous Given 01/12/19 1723)  metoCLOPramide (REGLAN) injection 10 mg (10 mg Intravenous Given 01/12/19 1723)     Initial Impression / Assessment and Plan / ED Course  I have reviewed the triage vital signs and the nursing notes.  Pertinent labs & imaging results that were available during my care of the patient were reviewed by me and considered in my medical decision making (see chart for details).  Clinical Course as of Jan 11 1837  Tue Jan 12, 2019  1838 Upon reassessment, patient reports that the headache has resolved. She continued to have no neurologic complains. Strict return precautions discussed, pt will return to the ER if there is  visual complains, seizures, altered mental status, loss of consciousness, dizziness, new focal weakness, or numbness.      [AN]    Clinical Course User Index [AN] Varney Biles, MD       63 year old female comes in a chief complaint of headache she has history of diabetes, hypertension, hyperlipidemia.  She denies any trauma.  Headache into 2 or 3 days, fairly constant.  No neurologic symptoms, no red flags for elevated ICP.  There is also no trauma, and family history is benign.  She does smoke and reports that every time she smokes her symptoms  gets worse.  I informed her that given her age and comorbidities, it is worthwhile getting a CT scan of the brain to make sure there is no tumor /mass.  Patient is adamant that her headaches are because of her smoking.  She prefers conservative management.  She actually has not taken any medications at home.  She prefers getting IV meds right now which we have ordered.  Differential diagnosis includes brain tumor but also intracranial bleed, brain aneurysm, cerebral venous thrombosis.  Chances of traumatic subdural or subarachnoid bleed is lower.  Blood pressure is in the [id number] systolic.   Patient does not want CT scan of the brain in the ED. Patient understands that her actions will lead to inadequate medical workup, and that she is at risk of complications of missed diagnosis, which includes morbidity and mortality.  Alternative options discussed -getting the CT scan if her headaches do not resolve with medications here. Opportunity to change mind given. Patient is demonstrating good capacity to make decision. Patient understands that she needs to return to the ER immediately if her symptoms get worse.  She will try to follow-up with her PCP and get additional work-up if not getting better.  6:38 PM Smoking cessation instruction/counseling given:  counseled patient on the dangers of tobacco use, advised patient to stop smoking, and reviewed  strategies to maximize success. Discussion 2-3 min.   Final Clinical Impressions(s) / ED Diagnoses   Final diagnoses:  Generalized headaches    ED Discharge Orders         Ordered    acetaminophen (TYLENOL 8 HOUR) 650 MG CR tablet  Every 8 hours     01/12/19 [place], Quantae Martel, MD 01/12/19 Bosie Helper

## 2019-01-12 NOTE — ED Notes (Signed)
CBG 96. 

## 2019-01-12 NOTE — ED Notes (Signed)
ED Provider at bedside. 

## 2019-07-06 ENCOUNTER — Emergency Department (HOSPITAL_COMMUNITY)
Admission: EM | Admit: 2019-07-06 | Discharge: 2019-07-06 | Disposition: A | Discharge status: Home / Self Care | Payer: Medicare (Managed Care) | Attending: Emergency Medicine | Admitting: Emergency Medicine

## 2019-07-06 ENCOUNTER — Other Ambulatory Visit: Payer: Self-pay

## 2019-07-06 ENCOUNTER — Encounter (HOSPITAL_COMMUNITY): Payer: Self-pay

## 2019-07-06 DIAGNOSIS — I10 Essential (primary) hypertension: Secondary | ICD-10-CM | POA: Diagnosis not present

## 2019-07-06 DIAGNOSIS — Z7984 Long term (current) use of oral hypoglycemic drugs: Secondary | ICD-10-CM | POA: Insufficient documentation

## 2019-07-06 DIAGNOSIS — Z7982 Long term (current) use of aspirin: Secondary | ICD-10-CM | POA: Insufficient documentation

## 2019-07-06 DIAGNOSIS — E119 Type 2 diabetes mellitus without complications: Secondary | ICD-10-CM | POA: Insufficient documentation

## 2019-07-06 DIAGNOSIS — R197 Diarrhea, unspecified: Secondary | ICD-10-CM | POA: Insufficient documentation

## 2019-07-06 DIAGNOSIS — R519 Headache, unspecified: Secondary | ICD-10-CM

## 2019-07-06 DIAGNOSIS — Z87891 Personal history of nicotine dependence: Secondary | ICD-10-CM | POA: Insufficient documentation

## 2019-07-06 DIAGNOSIS — Z79899 Other long term (current) drug therapy: Secondary | ICD-10-CM | POA: Diagnosis not present

## 2019-07-06 LAB — CBC
HCT: 41 % (ref 36.0–46.0)
Hemoglobin: 12.7 g/dL (ref 12.0–15.0)
MCH: 25.6 pg — ABNORMAL LOW (ref 26.0–34.0)
MCHC: 31 g/dL (ref 30.0–36.0)
MCV: 82.7 fL (ref 80.0–100.0)
Platelets: 249 10*3/uL (ref 150–400)
RBC: 4.96 MIL/uL (ref 3.87–5.11)
RDW: 15.9 % — ABNORMAL HIGH (ref 11.5–15.5)
WBC: 6.4 10*3/uL (ref 4.0–10.5)
nRBC: 0 % (ref 0.0–0.2)

## 2019-07-06 LAB — BASIC METABOLIC PANEL
Anion gap: 11 (ref 5–15)
BUN: 28 mg/dL — ABNORMAL HIGH (ref 8–23)
CO2: 25 mmol/L (ref 22–32)
Calcium: 9.4 mg/dL (ref 8.9–10.3)
Chloride: 99 mmol/L (ref 98–111)
Creatinine, Ser: 0.94 mg/dL (ref 0.44–1.00)
GFR calc Af Amer: >60 mL/min (ref >60)
GFR calc non Af Amer: >60 mL/min (ref >60)
Glucose, Bld: 86 mg/dL (ref 70–99)
Potassium: 3.5 mmol/L (ref 3.5–5.1)
Sodium: 135 mmol/L (ref 135–145)

## 2019-07-06 LAB — CBG MONITORING, ED: Glucose-Capillary: 71 mg/dL (ref 70–99)

## 2019-07-06 MED ORDER — PROCHLORPERAZINE EDISYLATE 10 MG/2ML IJ SOLN
10.0000 mg | Freq: Once | INTRAMUSCULAR | Status: AC
  Administered 2019-07-06: 10 mg via INTRAVENOUS
  Filled 2019-07-06: qty 2

## 2019-07-06 MED ORDER — DIPHENHYDRAMINE HCL 50 MG/ML IJ SOLN
25.0000 mg | Freq: Once | INTRAMUSCULAR | Status: AC
  Administered 2019-07-06: 25 mg via INTRAVENOUS
  Filled 2019-07-06: qty 1

## 2019-07-06 NOTE — Discharge Instructions (Signed)
Continue to drink plenty of fluids, Pedialyte, Gatorade to replenish fluids loss with diarrhea.  Return to the ER if your symptoms worsen.  Please follow-up with your primary care provider for possible migraine treatment.

## 2019-07-06 NOTE — ED Provider Notes (Signed)
[facility] Provider Note   CSN: [id number] Arrival date & time: 07/06/19  1427     History Chief Complaint  Patient presents with  . Headache    Teresa Sweeney is a 64 y.o. female.  HPI 64 year old female with history of hypertension, DM type II, hyperlipidemia, substance induced bipolar disorder presents to the ER for headache and diarrhea which began on Friday.  Patient reports gradual onset of headache, states that it is more frontal.  She denies any vision changes, but does endorse an episode of dizziness on Friday.  She also endorses watery diarrhea which began around simultaneously with her headache.  She denies any blood in her stool.  She has been tolerating p.o. well.  She reports compliance with her blood pressure medications, reports that she went to [facility] earlier in the week to measure her blood pressure and her systolic blood pressure was in the 170s.  She was then seen in her 81 office several days later and reports her systolic in the 0000000.  She reports trying to take Tylenol and her blood pressure medication without relief.  She is not on blood thinners.  She denies any chest pain, shortness of breath, weakness, syncope, LOC, nausea, vomiting, fevers, chills, dysuria, hematuria, abdominal pain, hematochezia, back pain.     Past Medical History:  Diagnosis Date  . Anxiety   . Depression   . Diabetes mellitus without complication ([place])   . Hyperlipemia   . Hypertension     Patient Active Problem List   Diagnosis Date Noted  . Diabetes ([place]) 04/15/2016  . Diabetic neuropathy ([place]) 04/15/2016  . HTN (hypertension) 04/12/2016  . Dyslipidemia 04/12/2016  . 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 ABDOMINAL HYSTERECTOMY       OB History   No obstetric history on file.     Family History  Problem Relation Age of Onset  . Heart  attack Mother   . Diabetes Sister   . Diabetes Other   . Hypertension Other   . Cancer Other     Social History   Tobacco Use  . Smoking status: Former Smoker    Packs/day: 1.00    Types: Cigarettes  . Smokeless tobacco: Never Used  Substance Use Topics  . Alcohol use: No  . Drug use: No    Home Medications Prior to Admission medications   Medication Sig Start Date End Date Taking? Authorizing Provider  acetaminophen (TYLENOL 8 HOUR) 650 MG CR tablet Take 1 tablet (650 mg total) by mouth every 8 (eight) hours. 01/12/19   Varney Biles, MD  aspirin EC 81 MG tablet Take 81 mg by mouth daily.    [provider]  escitalopram (LEXAPRO) 5 MG tablet Take 1 tablet by mouth daily. 06/12/17   [provider]  hydrochlorothiazide (HYDRODIURIL) 25 MG tablet Take 25 mg by mouth daily.    [provider]  metFORMIN (GLUCOPHAGE) 500 MG tablet Take 1 tablet (500 mg total) by mouth 2 (two) times daily with a meal. 04/15/16   Pucilowska, Jolanta B, MD  potassium chloride (K-DUR) 10 MEQ tablet Take 1 tablet (10 mEq total) by mouth 2 (two) times daily. 06/14/17   Davonna Belling, MD  pravastatin (PRAVACHOL) 40 MG tablet Take 40 mg by mouth at bedtime.  09/22/15   [provider]  QUEtiapine (SEROQUEL) 50 MG tablet Take 2 tablets (100 mg total) by mouth  at bedtime. 04/15/16   Pucilowska, Herma Ard B, MD  traZODone (DESYREL) 50 MG tablet Take 1 tablet (50 mg total) by mouth at bedtime. 06/06/17   Lily Kocher, PA-C  zolpidem (AMBIEN) 5 MG tablet Take 1 tablet (5 mg total) by mouth at bedtime as needed for sleep. 06/14/17   Davonna Belling, MD    Allergies    Lyrica [pregabalin]  Review of Systems   Review of Systems  Constitutional: Negative for chills and fever.  HENT: Negative for ear pain and sore throat.   Eyes: Negative for pain and visual disturbance.  Respiratory: Negative for cough and shortness of breath.   Cardiovascular: Negative for chest pain and  palpitations.  Gastrointestinal: Positive for diarrhea. Negative for abdominal pain, blood in stool and vomiting.  Genitourinary: Negative for dysuria and hematuria.  Musculoskeletal: Negative for arthralgias and back pain.  Skin: Negative for color change and rash.  Neurological: Positive for dizziness and headaches. Negative for seizures, syncope and weakness.  Psychiatric/Behavioral: Negative for confusion.  All other systems reviewed and are negative.   Physical Exam Updated Vital Signs BP (!) 145/79 (BP Location: Right Arm)   Pulse 74   Temp 98.4 F (36.9 C) (Oral)   Resp 16   Ht 5\' 1"  (1.549 m)   Wt 71.2 kg   SpO2 99%   BMI 29.66 kg/m   Physical Exam Vitals and nursing note reviewed.  Constitutional:      General: She is not in acute distress.    Appearance: She is well-developed. She is not ill-appearing or toxic-appearing.  HENT:     Head: Normocephalic and atraumatic.     Mouth/Throat:     Mouth: Mucous membranes are moist.  Eyes:     General: No visual field deficit.    Extraocular Movements: Extraocular movements intact.     Conjunctiva/sclera: Conjunctivae normal.  Neck:     Meningeal: Brudzinski's sign and Kernig's sign absent.  Cardiovascular:     Rate and Rhythm: Normal rate and regular rhythm.     Heart sounds: Normal heart sounds. No murmur.  Pulmonary:     Effort: Pulmonary effort is normal. No respiratory distress.     Breath sounds: Normal breath sounds.  Abdominal:     Palpations: Abdomen is soft.     Tenderness: There is no abdominal tenderness.  Musculoskeletal:        General: No swelling or tenderness. Normal range of motion.     Cervical back: Normal range of motion and neck supple. No rigidity.  Skin:    General: Skin is warm and dry.     Capillary Refill: Capillary refill takes less than 2 seconds.  Neurological:     Mental Status: She is alert.     GCS: GCS eye subscore is 4. GCS verbal subscore is 5. GCS motor subscore is 6.      Cranial Nerves: No cranial nerve deficit, dysarthria or facial asymmetry.     Sensory: No sensory deficit.     Motor: No weakness.     Coordination: Romberg sign negative. Coordination normal.     Gait: Gait normal.     Deep Tendon Reflexes: Reflexes normal.     Comments: Mental Status:  Alert, thought content appropriate, able to give a coherent history. Speech fluent without evidence of aphasia. Able to follow 2 step commands without difficulty.  Cranial Nerves:  II: Peripheral visual fields grossly normal, pupils equal, round, reactive to light III,IV, VI: ptosis not present, extra-ocular motions intact  bilaterally  V,VII: smile symmetric, facial light touch sensation equal VIII: hearing grossly normal to voice  X: uvula elevates symmetrically  XI: bilateral shoulder shrug symmetric and strong XII: midline tongue extension without fassiculations Motor:  Normal tone. 5/5 strength of BUE and BLE major muscle groups including strong and equal grip strength and dorsiflexion/plantar flexion Sensory: light touch normal in all extremities. Cerebellar: normal finger-to-nose with bilateral upper extremities, Romberg sign absent Gait: normal gait and balance. Able to walk on toes and heels with ease.    Psychiatric:        Mood and Affect: Mood normal.        Behavior: Behavior normal.     ED Results / Procedures / Treatments   Labs (all labs ordered are listed, but only abnormal results are displayed) Labs Reviewed  BASIC METABOLIC PANEL - Abnormal; Notable for the following components:      Result Value   BUN 28 (*)    All other components within normal limits  CBC - Abnormal; Notable for the following components:   MCH 25.6 (*)    RDW 15.9 (*)    All other components within normal limits  URINALYSIS, ROUTINE W REFLEX MICROSCOPIC  CBG MONITORING, ED    EKG EKG Interpretation  Date/Time:  Tuesday Jul 06 2019 14:45:23 EDT Ventricular Rate:  96 PR Interval:  126 QRS  Duration: 68 QT Interval:  342 QTC Calculation: 432 R Axis:   -16 Text Interpretation: Normal sinus rhythm Nonspecific T wave abnormality Abnormal ECG Confirmed by Noemi Chapel [phone]) on 07/06/2019 2:57:11 PM   Radiology No results found.  Procedures Procedures (including critical care time)  Medications Ordered in ED Medications  prochlorperazine (COMPAZINE) injection 10 mg (10 mg Intravenous Given 07/06/19 1722)  diphenhydrAMINE (BENADRYL) injection 25 mg (25 mg Intravenous Given 07/06/19 1720)    ED Course  I have reviewed the triage vital signs and the nursing notes.  Pertinent labs & imaging results that were available during my care of the patient were reviewed by me and considered in my medical decision making (see chart for details).    MDM Rules/Calculators/A&P                       64 year old female with headache and diarrhea since Friday. On presentation, patient is alert and oriented, no acute distress, nontoxic appearing, resting comfortably in the ER bed.  Patient is mildly hypertensive with a blood pressure of 146/86 in the ED, though this seems to be consistent with her previous ER visits.  Other vitals nonconcerning.  Physical exam without any abnormal acute neuro findings.  Abdomen soft and nontender.  Presentation similar to her typical headache and nonconcerning for [facility], ICH, meningitis, temporal arteritis. Pt is afebrile with no focal neuro deficits, nuchal rigidity, or change in vision.  CBC without evidence of infection, normal hemoglobin.  BMP without significant electrolyte abnormalities.  No concern for severe dehydration secondary to diarrhea, patient reports tolerating p.o. well.  Given soft and nontender abdomen along with no other abdominal symptoms, I do not think any additional abdominal imaging is indicated at this time.  Unclear cause of diarrhea.  Patient treated with migraine cocktail with significant symptom relief.  She does not have a history of  migraine headaches, but could be secondary to dehydration.  Encouraged follow-up with PCP, recommended patient take over-the-counter Imodium for her symptoms and continue with rehydration, including Pedialyte and Gatorade.  Strict return precautions given.  Patient voices understanding  is agreeable to this plan.  At this stage in the ED course, the patient is medically screened and is stable for discharge.  I discussed the case with Dr. Sabra Heck and he is agreeable to the above plan.   Final Clinical Impression(s) / ED Diagnoses Final diagnoses:  Acute nonintractable headache, unspecified headache type  Diarrhea, unspecified type    Rx / DC Orders ED Discharge Orders    None       Lyndel Safe 07/06/19 Joesph Fillers, MD 07/07/19 1428

## 2019-07-06 NOTE — ED Triage Notes (Signed)
Pt presents to ED with complaints of headache, dizziness and diarrhea since Friday. Pt denies fever.

## 2019-07-14 ENCOUNTER — Encounter (HOSPITAL_COMMUNITY): Payer: Self-pay | Admitting: *Deleted

## 2019-07-14 ENCOUNTER — Other Ambulatory Visit: Payer: Self-pay

## 2019-07-14 ENCOUNTER — Emergency Department (HOSPITAL_COMMUNITY)
Admission: EM | Admit: 2019-07-14 | Discharge: 2019-07-14 | Disposition: A | Discharge status: Home / Self Care | Payer: Medicare (Managed Care) | Attending: Emergency Medicine | Admitting: Emergency Medicine

## 2019-07-14 DIAGNOSIS — Z7982 Long term (current) use of aspirin: Secondary | ICD-10-CM | POA: Insufficient documentation

## 2019-07-14 DIAGNOSIS — I10 Essential (primary) hypertension: Secondary | ICD-10-CM | POA: Insufficient documentation

## 2019-07-14 DIAGNOSIS — Z79899 Other long term (current) drug therapy: Secondary | ICD-10-CM | POA: Insufficient documentation

## 2019-07-14 DIAGNOSIS — E119 Type 2 diabetes mellitus without complications: Secondary | ICD-10-CM | POA: Insufficient documentation

## 2019-07-14 DIAGNOSIS — R519 Headache, unspecified: Secondary | ICD-10-CM | POA: Insufficient documentation

## 2019-07-14 DIAGNOSIS — Z87891 Personal history of nicotine dependence: Secondary | ICD-10-CM | POA: Diagnosis not present

## 2019-07-14 DIAGNOSIS — R42 Dizziness and giddiness: Secondary | ICD-10-CM | POA: Diagnosis not present

## 2019-07-14 LAB — COMPREHENSIVE METABOLIC PANEL
ALT: 64 U/L — ABNORMAL HIGH (ref 0–44)
AST: 39 U/L (ref 15–41)
Albumin: 4.1 g/dL (ref 3.5–5.0)
Alkaline Phosphatase: 94 U/L (ref 38–126)
Anion gap: 10 (ref 5–15)
BUN: 29 mg/dL — ABNORMAL HIGH (ref 8–23)
CO2: 25 mmol/L (ref 22–32)
Calcium: 9.4 mg/dL (ref 8.9–10.3)
Chloride: 101 mmol/L (ref 98–111)
Creatinine, Ser: 1.19 mg/dL — ABNORMAL HIGH (ref 0.44–1.00)
GFR calc Af Amer: 55 mL/min — ABNORMAL LOW (ref >60)
GFR calc non Af Amer: 48 mL/min — ABNORMAL LOW (ref >60)
Glucose, Bld: 110 mg/dL — ABNORMAL HIGH (ref 70–99)
Potassium: 3.3 mmol/L — ABNORMAL LOW (ref 3.5–5.1)
Sodium: 136 mmol/L (ref 135–145)
Total Bilirubin: 0.4 mg/dL (ref 0.3–1.2)
Total Protein: 8.1 g/dL (ref 6.5–8.1)

## 2019-07-14 LAB — CBC WITH DIFFERENTIAL/PLATELET
Abs Immature Granulocytes: 0.04 10*3/uL (ref 0.00–0.07)
Basophils Absolute: 0 10*3/uL (ref 0.0–0.1)
Basophils Relative: 1 %
Eosinophils Absolute: 0.1 10*3/uL (ref 0.0–0.5)
Eosinophils Relative: 2 %
HCT: 38.7 % (ref 36.0–46.0)
Hemoglobin: 11.9 g/dL — ABNORMAL LOW (ref 12.0–15.0)
Immature Granulocytes: 1 %
Lymphocytes Relative: 26 %
Lymphs Abs: 1.4 10*3/uL (ref 0.7–4.0)
MCH: 25.4 pg — ABNORMAL LOW (ref 26.0–34.0)
MCHC: 30.7 g/dL (ref 30.0–36.0)
MCV: 82.5 fL (ref 80.0–100.0)
Monocytes Absolute: 0.5 10*3/uL (ref 0.1–1.0)
Monocytes Relative: 9 %
Neutro Abs: 3.4 10*3/uL (ref 1.7–7.7)
Neutrophils Relative %: 61 %
Platelets: 251 10*3/uL (ref 150–400)
RBC: 4.69 MIL/uL (ref 3.87–5.11)
RDW: 15.9 % — ABNORMAL HIGH (ref 11.5–15.5)
WBC: 5.5 10*3/uL (ref 4.0–10.5)
nRBC: 0 % (ref 0.0–0.2)

## 2019-07-14 LAB — ETHANOL: Alcohol, Ethyl (B): <10 mg/dL (ref <10)

## 2019-07-14 MED ORDER — PROCHLORPERAZINE EDISYLATE 10 MG/2ML IJ SOLN
10.0000 mg | Freq: Once | INTRAMUSCULAR | Status: AC
  Administered 2019-07-14: 10 mg via INTRAVENOUS
  Filled 2019-07-14: qty 2

## 2019-07-14 MED ORDER — SODIUM CHLORIDE 0.9 % IV BOLUS
1000.0000 mL | Freq: Once | INTRAVENOUS | Status: AC
  Administered 2019-07-14: 1000 mL via INTRAVENOUS

## 2019-07-14 NOTE — Discharge Instructions (Addendum)
As discussed, your evaluation today has been largely reassuring.  But, it is important that you monitor your condition carefully, and do not hesitate to return to the ED if you develop new, or concerning changes in your condition. ? ?Otherwise, please follow-up with your physician for appropriate ongoing care. ? ?

## 2019-07-14 NOTE — ED Provider Notes (Signed)
[facility] EMERGENCY DEPARTMENT Provider Note   CSN: [id number] Arrival date & time: 07/14/19  1331     History Chief Complaint  Patient presents with  . Dizziness    Teresa Sweeney is a 64 y.o. female.  HPI Patient p/w dizziness but also w HA.  Sx have been present for possibly one week, included after prior ED eval.  There is currently negligible pain but HA has been inconsistent.  She largely p/w concern of ongoing "dizziness" described as unsteadiness, but not near-syncopal, nor vertiginous.  She denies weakness in any extremity or MS changes. On ROS she notes that she did start one new med about two weeks ago (before any Sx were present.  She is unsure of the med.)     Past Medical History:  Diagnosis Date  . Anxiety   . Depression   . Diabetes mellitus without complication ([place])   . Hyperlipemia   . Hypertension     Patient Active Problem List   Diagnosis Date Noted  . Diabetes ([place]) 04/15/2016  . Diabetic neuropathy ([place]) 04/15/2016  . HTN (hypertension) 04/12/2016  . Dyslipidemia 04/12/2016  . 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 ABDOMINAL HYSTERECTOMY       OB History   No obstetric history on file.     Family History  Problem Relation Age of Onset  . Heart attack Mother   . Diabetes Sister   . Diabetes Other   . Hypertension Other   . Cancer Other     Social History   Tobacco Use  . Smoking status: Former Smoker    Packs/day: 1.00    Types: Cigarettes  . Smokeless tobacco: Never Used  Substance Use Topics  . Alcohol use: No  . Drug use: No    Home Medications Prior to Admission medications   Medication Sig Start Date End Date Taking? Authorizing Provider  acetaminophen (TYLENOL 8 HOUR) 650 MG CR tablet Take 1 tablet (650 mg total) by mouth every 8 (eight) hours. 01/12/19  Yes Nanavati, Ankit, MD  amLODipine (NORVASC) 5 MG tablet  Take 5 mg by mouth daily. 07/02/19  Yes [provider]  aspirin EC 81 MG tablet Take 81 mg by mouth daily.   Yes [provider]  hydrochlorothiazide (HYDRODIURIL) 25 MG tablet Take 25 mg by mouth daily.   Yes [provider]  metFORMIN (GLUCOPHAGE) 500 MG tablet Take 1 tablet (500 mg total) by mouth 2 (two) times daily with a meal. 04/15/16  Yes Pucilowska, Jolanta B, MD  mirtazapine (REMERON) 15 MG tablet Take 15 mg by mouth at bedtime. 07/06/19  Yes [provider]  pravastatin (PRAVACHOL) 40 MG tablet Take 40 mg by mouth at bedtime.  09/22/15  Yes [provider]  QUEtiapine (SEROQUEL) 50 MG tablet Take 2 tablets (100 mg total) by mouth at bedtime. 04/15/16  Yes Pucilowska, Jolanta B, MD  potassium chloride (K-DUR) 10 MEQ tablet Take 1 tablet (10 mEq total) by mouth 2 (two) times daily. Patient not taking: Reported on 07/14/2019 06/14/17   Davonna Belling, MD  traZODone (DESYREL) 50 MG tablet Take 1 tablet (50 mg total) by mouth at bedtime. Patient not taking: Reported on 07/14/2019 06/06/17   Lily Kocher, PA-C  zolpidem (AMBIEN) 5 MG tablet Take 1 tablet (5 mg total) by mouth at bedtime as needed for sleep. Patient not taking: Reported on 07/14/2019 06/14/17   Davonna Belling,  MD    Allergies    Lyrica [pregabalin]  Review of Systems   Review of Systems  Constitutional:       Per HPI, otherwise negative  HENT:       Per HPI, otherwise negative  Respiratory:       Per HPI, otherwise negative  Cardiovascular:       Per HPI, otherwise negative  Gastrointestinal: Negative for vomiting.  Endocrine:       Negative aside from HPI  Genitourinary:       Neg aside from HPI   Musculoskeletal:       Per HPI, otherwise negative  Skin: Negative.   Neurological: Positive for dizziness and headaches. Negative for syncope, facial asymmetry, speech difficulty and weakness.    Physical Exam Updated Vital Signs BP (!) 145/84 (BP Location: Right Arm)    Pulse 94   Temp 98.6 F (37 C) (Oral)   Resp 18   Ht 5\' 1"  (1.549 m)   Wt 71.2 kg   SpO2 98%   BMI 29.66 kg/m   Physical Exam Vitals and nursing note reviewed.  Constitutional:      General: She is not in acute distress.    Appearance: She is well-developed.  HENT:     Head: Normocephalic and atraumatic.  Eyes:     Conjunctiva/sclera: Conjunctivae normal.  Cardiovascular:     Rate and Rhythm: Normal rate and regular rhythm.  Pulmonary:     Effort: Pulmonary effort is normal. No respiratory distress.     Breath sounds: Normal breath sounds. No stridor.  Abdominal:     General: There is no distension.  Skin:    General: Skin is warm and dry.  Neurological:     General: No focal deficit present.     Mental Status: She is alert and oriented to person, place, and time.     Cranial Nerves: No cranial nerve deficit, dysarthria or facial asymmetry.     Motor: No weakness, tremor or atrophy.     ED Results / Procedures / Treatments   Labs (all labs ordered are listed, but only abnormal results are displayed) Labs Reviewed  COMPREHENSIVE METABOLIC PANEL - Abnormal; Notable for the following components:      Result Value   Potassium 3.3 (*)    Glucose, Bld 110 (*)    BUN 29 (*)    Creatinine, Ser 1.19 (*)    ALT 64 (*)    GFR calc non Af Amer 48 (*)    GFR calc Af Amer 55 (*)    All other components within normal limits  CBC WITH DIFFERENTIAL/PLATELET - Abnormal; Notable for the following components:   Hemoglobin 11.9 (*)    MCH 25.4 (*)    RDW 15.9 (*)    All other components within normal limits  ETHANOL    EKG None  Radiology No results found.  Procedures Procedures (including critical care time)  Medications Ordered in ED Medications  sodium chloride 0.9 % bolus 1,000 mL (0 mLs Intravenous Stopped 07/14/19 1545)  prochlorperazine (COMPAZINE) injection 10 mg (10 mg Intravenous Given 07/14/19 1447)    ED Course  I have reviewed the triage vital signs  and the nursing notes.  Pertinent labs & imaging results that were available during my care of the patient were reviewed by me and considered in my medical decision making (see chart for details).  On repeat exam the patient appears calm, and states that she feels better.  No ongoing complaints.  With suspicion for med reaction and no e/o CNS dysfunction / infection / end organ effects / worsening from HTN she is appropriate for outpatient f/u, which she states she will facilitate tomorrow. Final Clinical Impression(s) / ED Diagnoses Final diagnoses:  Dizziness     Carmin Muskrat, MD 07/14/19 2141

## 2019-07-14 NOTE — ED Triage Notes (Signed)
Pt c/o continued dizziness since last week when she was seen at the ED. Pt reports she was told she had a migraine last week. Pt says the pain is very slight in her head but it's more the dizziness than anything.

## 2019-08-24 ENCOUNTER — Other Ambulatory Visit (HOSPITAL_COMMUNITY): Payer: Self-pay | Admitting: Psychology

## 2019-08-24 ENCOUNTER — Other Ambulatory Visit: Payer: Self-pay | Admitting: Psychology

## 2019-08-24 DIAGNOSIS — R7401 Elevation of levels of liver transaminase levels: Secondary | ICD-10-CM

## 2019-08-31 ENCOUNTER — Other Ambulatory Visit: Payer: Self-pay

## 2019-08-31 ENCOUNTER — Ambulatory Visit (HOSPITAL_COMMUNITY)
Admission: RE | Admit: 2019-08-31 | Discharge: 2019-08-31 | Disposition: A | Discharge status: Home / Self Care | Payer: Medicare (Managed Care) | Source: Ambulatory Visit | Attending: Psychology | Admitting: Psychology

## 2019-08-31 DIAGNOSIS — R7401 Elevation of levels of liver transaminase levels: Secondary | ICD-10-CM | POA: Insufficient documentation

## 2019-10-01 LAB — EXTERNAL GENERIC LAB PROCEDURE

## 2020-02-03 ENCOUNTER — Other Ambulatory Visit: Payer: Self-pay

## 2020-02-03 ENCOUNTER — Emergency Department (HOSPITAL_COMMUNITY)
Admission: EM | Admit: 2020-02-03 | Discharge: 2020-02-03 | Disposition: A | Discharge status: Home / Self Care | Payer: Medicare (Managed Care) | Attending: Emergency Medicine | Admitting: Emergency Medicine

## 2020-02-03 ENCOUNTER — Encounter (HOSPITAL_COMMUNITY): Payer: Self-pay | Admitting: Emergency Medicine

## 2020-02-03 ENCOUNTER — Emergency Department (HOSPITAL_COMMUNITY): Payer: Medicare (Managed Care)

## 2020-02-03 DIAGNOSIS — E1140 Type 2 diabetes mellitus with diabetic neuropathy, unspecified: Secondary | ICD-10-CM | POA: Insufficient documentation

## 2020-02-03 DIAGNOSIS — Z79899 Other long term (current) drug therapy: Secondary | ICD-10-CM | POA: Diagnosis not present

## 2020-02-03 DIAGNOSIS — E785 Hyperlipidemia, unspecified: Secondary | ICD-10-CM | POA: Diagnosis not present

## 2020-02-03 DIAGNOSIS — Z7982 Long term (current) use of aspirin: Secondary | ICD-10-CM | POA: Diagnosis not present

## 2020-02-03 DIAGNOSIS — Z7984 Long term (current) use of oral hypoglycemic drugs: Secondary | ICD-10-CM | POA: Insufficient documentation

## 2020-02-03 DIAGNOSIS — E1169 Type 2 diabetes mellitus with other specified complication: Secondary | ICD-10-CM | POA: Diagnosis not present

## 2020-02-03 DIAGNOSIS — R42 Dizziness and giddiness: Secondary | ICD-10-CM

## 2020-02-03 DIAGNOSIS — Z87891 Personal history of nicotine dependence: Secondary | ICD-10-CM | POA: Diagnosis not present

## 2020-02-03 DIAGNOSIS — R000 Tachycardia, unspecified: Secondary | ICD-10-CM

## 2020-02-03 DIAGNOSIS — I10 Essential (primary) hypertension: Secondary | ICD-10-CM | POA: Diagnosis not present

## 2020-02-03 LAB — CBC
HCT: 39 % (ref 36.0–46.0)
Hemoglobin: 12.1 g/dL (ref 12.0–15.0)
MCH: 25 pg — ABNORMAL LOW (ref 26.0–34.0)
MCHC: 31 g/dL (ref 30.0–36.0)
MCV: 80.6 fL (ref 80.0–100.0)
Platelets: 265 10*3/uL (ref 150–400)
RBC: 4.84 MIL/uL (ref 3.87–5.11)
RDW: 15.6 % — ABNORMAL HIGH (ref 11.5–15.5)
WBC: 5.9 10*3/uL (ref 4.0–10.5)
nRBC: 0 % (ref 0.0–0.2)

## 2020-02-03 LAB — TROPONIN I (HIGH SENSITIVITY): Troponin I (High Sensitivity): <2 ng/L (ref <18)

## 2020-02-03 LAB — BASIC METABOLIC PANEL
Anion gap: 12 (ref 5–15)
BUN: 22 mg/dL (ref 8–23)
CO2: 23 mmol/L (ref 22–32)
Calcium: 9.6 mg/dL (ref 8.9–10.3)
Chloride: 100 mmol/L (ref 98–111)
Creatinine, Ser: 0.93 mg/dL (ref 0.44–1.00)
GFR, Estimated: >60 mL/min (ref >60)
Glucose, Bld: 106 mg/dL — ABNORMAL HIGH (ref 70–99)
Potassium: 3.8 mmol/L (ref 3.5–5.1)
Sodium: 135 mmol/L (ref 135–145)

## 2020-02-03 LAB — TSH: TSH: 1.327 u[IU]/mL (ref 0.350–4.500)

## 2020-02-03 LAB — D-DIMER, QUANTITATIVE: D-Dimer, Quant: 0.41 ug/mL-FEU (ref 0.00–0.50)

## 2020-02-03 NOTE — Discharge Instructions (Signed)
You were evaluated in the emergency department today for your lightheadedness and fast heart rate.  Your physical exam, blood work, EKG, and chest x-ray were very reassuring.  There are no signs of heart attack or blood clot, there are no signs of infection at this time.  This is very good news.  While the exact cause for your symptoms remains unclear, there does not appear to be any emergent cause of your symptoms.  Please follow-up with your primary care doctor regarding the dizziness.  Regarding your rapid heart rate, while it is resolved at this time I still feel it is important he follow-up with the heart doctor.  Below is the contact information for Dr. Domenic Polite, the cardiologist here in [place].  Please call his office tomorrow to schedule an appointment for the next week as an emergency room follow-up.  Return to the emergency department if you develop any blurry vision, double vision, loss of consciousness, chest pain, shortness of breath, palpitations, or other new severe symptoms.

## 2020-02-03 NOTE — ED Triage Notes (Signed)
Pt states she was seen today by urgent care for dizziness and was told to come here for abnormal EKG.  pt states she started having dizziness this morning.

## 2020-02-03 NOTE — ED Provider Notes (Signed)
[facility] EMERGENCY DEPARTMENT Provider Note   CSN: [id number] Arrival date & time: 02/03/20  1233     History Chief Complaint  Patient presents with  . Dizziness    Teresa Sweeney is a 64 y.o. female  with history of type 2 diabetes and hypertension who presents today with concern for lightheadedness since this morning.  She states that this has been happening every morning for quite some time.  She also states that she felt like the room was spinning when she first stood up out of bed today.  At this time she states the room is no longer spinning, however she feels "swimmy headed".  She states she presented to urgent care this morning, when they told her she had an abnormal EKG and sent her to the emergency department.  She does not have the EKG with her and does not know what was abnormal about it.  She denies head trauma, LOC, nausea, vomiting,.  Denies chest pains, shortness of breath, palpitations.  Denies history of symptoms similar to this in the past.  She denies recent travel or prolonged immobilization.  Denies recent surgical procedure, hormone replacement therapy, history of malignancy or history of blood clot in the past.  She denies known family history of blood clots.  She denies fevers, chills at home, but she does endorse history of sweats.  She states that these have been happening for many years, not isolated to menopause, and not worsened in recent past.  Patient states that she lives with her brother for many many years, and unfortunately he passed away in 12/04/22 of this year.  She states that she has been feeling low, going through the grieving process this past few months.  Her other brother is at the bedside at this time.   I personally reviewed the patient's medical records.  She has history of hypertension, hyperlipidemia, diabetes mellitus, anxiety and depression.  She has history of abdominal hysterectomy.   HPI     Past Medical History:   Diagnosis Date  . Anxiety   . Depression   . Diabetes mellitus without complication ([place])   . Hyperlipemia   . Hypertension     Patient Active Problem List   Diagnosis Date Noted  . Diabetes ([place]) 04/15/2016  . Diabetic neuropathy ([place]) 04/15/2016  . HTN (hypertension) 04/12/2016  . Dyslipidemia 04/12/2016  . 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 ABDOMINAL HYSTERECTOMY       OB History   No obstetric history on file.     Family History  Problem Relation Age of Onset  . Heart attack Mother   . Diabetes Sister   . Diabetes Other   . Hypertension Other   . Cancer Other     Social History   Tobacco Use  . Smoking status: Former Smoker    Packs/day: 1.00    Types: Cigarettes  . Smokeless tobacco: Never Used  Vaping Use  . Vaping Use: Never used  Substance Use Topics  . Alcohol use: No  . Drug use: No    Home Medications Prior to Admission medications   Medication Sig Start Date End Date Taking? Authorizing Provider  amLODipine (NORVASC) 5 MG tablet Take 5 mg by mouth daily. 07/02/19  Yes [provider]  aspirin EC 81 MG tablet Take 81 mg by mouth daily.   Yes [provider]  hydrochlorothiazide (HYDRODIURIL) 25 MG tablet Take  25 mg by mouth daily.   Yes [provider]  metFORMIN (GLUCOPHAGE) 500 MG tablet Take 1 tablet (500 mg total) by mouth 2 (two) times daily with a meal. 04/15/16  Yes Pucilowska, Jolanta B, MD  mirtazapine (REMERON) 15 MG tablet Take 15 mg by mouth at bedtime. 07/06/19  Yes [provider]  pravastatin (PRAVACHOL) 40 MG tablet Take 40 mg by mouth at bedtime.  09/22/15  Yes [provider]  QUEtiapine (SEROQUEL) 25 MG tablet Take 25 mg by mouth at bedtime. 01/11/20  Yes [provider]  acetaminophen (TYLENOL 8 HOUR) 650 MG CR tablet Take 1 tablet (650 mg total) by mouth every 8 (eight) hours.  01/12/19   Varney Biles, MD  potassium chloride (K-DUR) 10 MEQ tablet Take 1 tablet (10 mEq total) by mouth 2 (two) times daily. Patient not taking: No sig reported 06/14/17   Davonna Belling, MD  traZODone (DESYREL) 50 MG tablet Take 1 tablet (50 mg total) by mouth at bedtime. Patient not taking: No sig reported 06/06/17   Lily Kocher, PA-C  zolpidem (AMBIEN) 5 MG tablet Take 1 tablet (5 mg total) by mouth at bedtime as needed for sleep. Patient not taking: No sig reported 06/14/17   Davonna Belling, MD    Allergies    Lyrica [pregabalin]  Review of Systems   Review of Systems  Constitutional: Positive for diaphoresis. Negative for activity change, appetite change, chills, fatigue and fever.  HENT: Negative.   Eyes: Negative.  Negative for photophobia and visual disturbance.  Respiratory: Negative for cough, chest tightness, shortness of breath and wheezing.   Cardiovascular: Negative for chest pain, palpitations and leg swelling.  Gastrointestinal: Negative for abdominal pain, diarrhea, nausea and vomiting.  Endocrine: Negative.   Genitourinary: Negative.   Musculoskeletal: Negative.   Skin: Negative.   Neurological: Positive for dizziness, light-headedness and headaches. Negative for tremors, seizures, syncope, facial asymmetry, speech difficulty, weakness and numbness.  Hematological: Negative.   Psychiatric/Behavioral: Negative.        Grieving recent death of her brother, with whom she lived for many years.     Physical Exam Updated Vital Signs BP 128/82   Pulse 92   Temp 98.5 F (36.9 C) (Oral)   Resp 17   Ht 5\' 2"  (1.575 m)   Wt 72.6 kg   SpO2 98%   BMI 29.26 kg/m   Physical Exam Vitals and nursing note reviewed.  HENT:     Head: Normocephalic and atraumatic.     Nose: Nose normal.     Mouth/Throat:     Mouth: Mucous membranes are moist.     Pharynx: Oropharynx is clear. No oropharyngeal exudate or posterior oropharyngeal erythema.  Eyes:      General:        Right eye: No discharge.        Left eye: No discharge.     Extraocular Movements: Extraocular movements intact.     Conjunctiva/sclera: Conjunctivae normal.     Pupils: Pupils are equal, round, and reactive to light.     Visual Fields: Right eye visual fields normal and left eye visual fields normal.  Neck:     Thyroid: No thyroid mass.     Trachea: Trachea and phonation normal.  Cardiovascular:     Rate and Rhythm: Regular rhythm. Tachycardia present.     Pulses:          Radial pulses are 2+ on the right side and 2+ on the left side.  Dorsalis pedis pulses are 1+ on the right side and 1+ on the left side.     Heart sounds: Normal heart sounds. No murmur heard.   Pulmonary:     Effort: Pulmonary effort is normal. No accessory muscle usage, prolonged expiration or respiratory distress.     Breath sounds: Normal breath sounds. No wheezing or rales.  Chest:     Chest wall: No deformity, swelling, tenderness, crepitus or edema.  Abdominal:     General: Bowel sounds are normal. There is no distension.     Palpations: Abdomen is soft.     Tenderness: There is no abdominal tenderness. There is no guarding or rebound.  Musculoskeletal:        General: No deformity.     Cervical back: Neck supple. No rigidity, tenderness or crepitus. No pain with movement, spinous process tenderness or muscular tenderness.     Right lower leg: No edema.     Left lower leg: No edema.  Lymphadenopathy:     Cervical: No cervical adenopathy.  Skin:    General: Skin is warm and dry.     Capillary Refill: Capillary refill takes less than 2 seconds.  Neurological:     General: No focal deficit present.     Mental Status: She is alert and oriented to person, place, and time.     Cranial Nerves: Cranial nerves are intact.     Sensory: Sensation is intact.     Motor: Motor function is intact.     Coordination: Coordination is intact.     Gait: Gait is intact.  Psychiatric:         Mood and Affect: Mood normal.     ED Results / Procedures / Treatments   Labs (all labs ordered are listed, but only abnormal results are displayed) Labs Reviewed  BASIC METABOLIC PANEL - Abnormal; Notable for the following components:      Result Value   Glucose, Bld 106 (*)    All other components within normal limits  CBC - Abnormal; Notable for the following components:   MCH 25.0 (*)    RDW 15.6 (*)    All other components within normal limits  D-DIMER, QUANTITATIVE (NOT AT [facility])  TSH  TROPONIN I (HIGH SENSITIVITY)    EKG EKG Interpretation  Date/Time:  Thursday February 03 2020 13:07:16 EST Ventricular Rate:  107 PR Interval:  126 QRS Duration: 64 QT Interval:  306 QTC Calculation: 408 R Axis:   -13 Text Interpretation: Sinus tachycardia Nonspecific T wave abnormality Abnormal ECG Confirmed by Davonna Belling [phone]) on 02/03/2020 3:48:35 PM   Radiology DG Chest Port 1 View  Result Date: 02/03/2020 CLINICAL DATA:  Tachycardia. EXAM: PORTABLE CHEST 1 VIEW COMPARISON:  December 08, 2015 FINDINGS: Very mild, diffuse chronic appearing increased lung markings are seen. There is no evidence of acute infiltrate, pleural effusion or pneumothorax. The heart size and mediastinal contours are within normal limits. Degenerative changes seen throughout the thoracic spine. IMPRESSION: No acute or active cardiopulmonary disease. Electronically Signed   By: Virgina Norfolk M.D.   On: 02/03/2020 17:12    Procedures Procedures (including critical care time)  Medications Ordered in ED Medications - No data to display  ED Course  I have reviewed the triage vital signs and the nursing notes.  Pertinent labs & imaging results that were available during my care of the patient were reviewed by me and considered in my medical decision making (see chart for details).    MDM  Rules/Calculators/A&P                         64 year old female presents with concern for dizziness and  lightheadedness since this morning, as well as abnormal EKG at urgent care this afternoon, for which she was sent to the emergency department.  Differential diagnosis for the patient's symptoms include but are not limited to arrhythmia, sinus tachycardia, PE, pneumonia, vertigo, anemia, sepsis, anxiety.   Patient tachycardic on intake to 102, hypertensive to 154/97.  Vital signs otherwise normal.  Pulmonary exam is normal, cardiac exam revealed tachycardia to the 120s with regular rhythm.  Neuro exam is normal, without acute focal deficit.  We will proceed with basic laboratory studies, including troponin and D-dimer, as patient is PERC positive.  EKG with sinus tachycardia, nonspecific T wave abnormalities.  Chest x-ray negative for acute cardiopulmonary disease.  CBC unremarkable, BMP unremarkable.   Troponin negative, <2.   D-dimer, negative, 0.41.   Case discussed with attending physician.   Patient was reevaluated.  She is no longer tachycardic, now with heart rate of 90 on cardiac monitor. Additionally she endorses resolution of her lightheadedness/dizziness.  Given reassuring physical exam, laboratory studies, chest x-ray, EKG, do not feel any further work-up is warranted in the emergency department at this time.  TSH is pending, I informed this patient that she should follow-up on this lab results in her MyChart app.  We will provide contact information for cardiology; recommend close follow-up with their service.  While exact cause for this patient's symptoms remain unclear, there does not appear to be an emergent cardiac or pulmonary etiology.  Neyra voiced understanding of her medical evaluation and treatment plan.  Each of her questions were answered to her expressed satisfaction.  Strict return precautions were given.  Patient is well-appearing, stable, and is appropriate for discharge at this time.  Final Clinical Impression(s) / ED Diagnoses Final diagnoses:   Lightheadedness  Tachycardia    Rx / DC Orders ED Discharge Orders    None       Aura Dials 02/03/20 1856    Davonna Belling, MD 02/03/20 2316

## 2020-08-02 ENCOUNTER — Encounter (INDEPENDENT_AMBULATORY_CARE_PROVIDER_SITE_OTHER): Payer: Self-pay | Admitting: *Deleted

## 2020-09-19 NOTE — Progress Notes (Signed)
CARDIOLOGY CONSULT NOTE       Patient ID: Teresa Sweeney MRN: [id number] DOB/AGE: 06/24/1955 65 y.o.  Admit date: (Not on file) Referring Physician: Earma Reading Primary Physician: [facility] Primary Cardiologist: New Reason for Consultation: Pre syncope  Active Problems:   * No active hospital problems. *   HPI:  65 y.o. referred by Dr Earma Reading [facility] for pre syncopal spells She has a history of HTN, HLD and DM as well as anxiety Seen in [facility] ED 02/03/20 with dizziness ECG/Tele with some sinus tachycardia but no arrhythmia Labs showed normal d dimer, Hct, TSH and negative troponin She was mildly azotemic with BUN 29 and Cr 1.19 She was on a diuretic for HTN She was also taking seroquel, remeron , trazodone and ambien   Labs 07/27/20 A1c 6.8 Hct 34.7   She really has no complaints for me today She said issues were related to headaches that are better now   ROS All other systems reviewed and negative except as noted above  Past Medical History:  Diagnosis Date   Anxiety    Depression    Diabetes mellitus without complication ([place])    Hyperlipemia    Hypertension     Family History  Problem Relation Age of Onset   Heart attack Mother    Diabetes Sister    Diabetes Other    Hypertension Other    Cancer Other     Social History   Socioeconomic History   Marital status: Single    Spouse name: Not on file   Number of children: Not on file   Years of education: Not on file   Highest education level: Not on file  Occupational History   Not on file  Tobacco Use   Smoking status: Former    Packs/day: 1.00    Types: Cigarettes   Smokeless tobacco: Never  Vaping Use   Vaping Use: Never used  Substance and Sexual Activity   Alcohol use: No   Drug use: No   Sexual activity: Not on file  Other Topics Concern   Not on file  Social History Narrative   Not on file   Social Determinants of Health   Financial Resource  Strain: Not on file  Food Insecurity: Not on file  Transportation Needs: Not on file  Physical Activity: Not on file  Stress: Not on file  Social Connections: Not on file  Intimate Partner Violence: Not on file    Past Surgical History:  Procedure Laterality Date   ABDOMINAL HYSTERECTOMY        Current Outpatient Medications:    acetaminophen (TYLENOL 8 HOUR) 650 MG CR tablet, Take 1 tablet (650 mg total) by mouth every 8 (eight) hours., Disp: 30 tablet, Rfl: 0   amLODipine (NORVASC) 5 MG tablet, Take 5 mg by mouth daily., Disp: , Rfl:    aspirin EC 81 MG tablet, Take 81 mg by mouth daily., Disp: , Rfl:    hydrochlorothiazide (HYDRODIURIL) 25 MG tablet, Take 25 mg by mouth daily., Disp: , Rfl:    metFORMIN (GLUCOPHAGE) 500 MG tablet, Take 1 tablet (500 mg total) by mouth 2 (two) times daily with a meal., Disp: 60 tablet, Rfl: 1   mirtazapine (REMERON) 15 MG tablet, Take 15 mg by mouth at bedtime., Disp: , Rfl:    potassium chloride (K-DUR) 10 MEQ tablet, Take 1 tablet (10 mEq total) by mouth 2 (two) times daily. (Patient not taking: No sig reported),  Disp: 10 tablet, Rfl: 0   pravastatin (PRAVACHOL) 40 MG tablet, Take 40 mg by mouth at bedtime. , Disp: , Rfl:    QUEtiapine (SEROQUEL) 25 MG tablet, Take 25 mg by mouth at bedtime., Disp: , Rfl:    traZODone (DESYREL) 50 MG tablet, Take 1 tablet (50 mg total) by mouth at bedtime. (Patient not taking: No sig reported), Disp: 7 tablet, Rfl: 0   zolpidem (AMBIEN) 5 MG tablet, Take 1 tablet (5 mg total) by mouth at bedtime as needed for sleep. (Patient not taking: No sig reported), Disp: 10 tablet, Rfl: 0    Physical Exam: There were no vitals taken for this visit.    Affect appropriate Healthy:  appears stated age 65: normal Neck supple with no adenopathy JVP normal no bruits no thyromegaly Lungs clear with no wheezing and good diaphragmatic motion Heart:  S1/S2 2/6/SEM  murmur, no rub, gallop or click PMI normal Abdomen:  benighn, BS positve, no tenderness, no AAA no bruit.  No HSM or HJR Distal pulses intact with no bruits No edema Neuro non-focal Skin warm and dry No muscular weakness   Labs:   Lab Results  Component Value Date   WBC 5.9 02/03/2020   HGB 12.1 02/03/2020   HCT 39.0 02/03/2020   MCV 80.6 02/03/2020   PLT 265 02/03/2020   No results for input(s): NA, K, CL, CO2, BUN, CREATININE, CALCIUM, PROT, BILITOT, ALKPHOS, ALT, AST, GLUCOSE in the last 168 hours.  Invalid input(s): LABALBU Lab Results  Component Value Date   [id number] 04/11/2016   TROPONINI <0.03 09/28/2015    Lab Results  Component Value Date   CHOL 181 04/12/2016   Lab Results  Component Value Date   HDL 56 04/12/2016   Lab Results  Component Value Date   LDLCALC 102 (H) 04/12/2016   Lab Results  Component Value Date   TRIG 116 04/12/2016   Lab Results  Component Value Date   CHOLHDL 3.2 04/12/2016   No results found for: LDLDIRECT    Radiology: No results found.  EKG: 02/04/20 ST rate 107 nonspecific ST changes 09/26/2020 ST rate 108 nonspecific ST changes    ASSESSMENT AND PLAN:   Pre syncope:  in setting of diuretic use and azotemia on labs as well as multiple psycho active meds ECG not high risk but tachycardia Pulse on d/c from ER 90's will check echo and 3 day monitor for average HR see below HTN:  D/c amlodipine diuretic and K start Toprol 50 mg daily  HLD:  continue statin  DM:  Discussed low carb diet.  Target hemoglobin A1c is 6.5 or less.  Continue current medications. Anxiety / Depression:  f/u primary on too many psychoactive drugs  Tachycardia:  Monitor start beta blocker echo to r/o structural heart issue Patient does have SEM on exam   D/c Amlodipine, HCTZ, K Start Toprol 50 mg daily  3 day monitor  TTE  F/U in 3 months   Signed: Jenkins Rouge 09/19/2020, 5:05 PM

## 2020-09-26 ENCOUNTER — Other Ambulatory Visit: Payer: Self-pay | Admitting: Cardiovascular Disease

## 2020-09-26 ENCOUNTER — Ambulatory Visit: Payer: Medicare (Managed Care) | Admitting: Cardiovascular Disease

## 2020-09-26 ENCOUNTER — Encounter (INDEPENDENT_AMBULATORY_CARE_PROVIDER_SITE_OTHER): Payer: Self-pay

## 2020-09-26 ENCOUNTER — Encounter: Payer: Self-pay | Admitting: Cardiovascular Disease

## 2020-09-26 ENCOUNTER — Other Ambulatory Visit: Payer: Self-pay

## 2020-09-26 ENCOUNTER — Ambulatory Visit (INDEPENDENT_AMBULATORY_CARE_PROVIDER_SITE_OTHER): Payer: Medicare (Managed Care)

## 2020-09-26 VITALS — BP 120/68 | HR 108 | Ht 61.0 in | Wt 165.0 lb

## 2020-09-26 DIAGNOSIS — R55 Syncope and collapse: Secondary | ICD-10-CM

## 2020-09-26 DIAGNOSIS — I10 Essential (primary) hypertension: Secondary | ICD-10-CM

## 2020-09-26 DIAGNOSIS — E782 Mixed hyperlipidemia: Secondary | ICD-10-CM

## 2020-09-26 DIAGNOSIS — R000 Tachycardia, unspecified: Secondary | ICD-10-CM | POA: Diagnosis not present

## 2020-09-26 DIAGNOSIS — R011 Cardiac murmur, unspecified: Secondary | ICD-10-CM

## 2020-09-26 MED ORDER — METOPROLOL SUCCINATE ER 50 MG PO TB24: 50.0000 mg | ORAL_TABLET | Freq: Every day | ORAL | 3 refills | Status: DC

## 2020-09-26 NOTE — Patient Instructions (Signed)
Medication Instructions:  After completing both Heart monitor and Echo  Stop taking Norvasc  Stop Taking Potassium  Stop Taking HCTZ  Start Taking Toprol XL 50 mg Daily   *If you need a refill on your cardiac medications before your next appointment, please call your pharmacy*   Lab Work: NONE   If you have labs (blood work) drawn today and your tests are completely normal, you will receive your results only by: [place] (if you have MyChart) OR A paper copy in the mail If you have any lab test that is abnormal or we need to change your treatment, we will call you to review the results.   Testing/Procedures: Your physician has requested that you have an echocardiogram. Echocardiography is a painless test that uses sound waves to create images of your heart. It provides your doctor with information about the size and shape of your heart and how well your heart's chambers and valves are working. This procedure takes approximately one hour. There are no restrictions for this procedure.  ZIO XT- Long Term Monitor Instructions   Your physician has requested you wear your ZIO patch monitor___3__days.   This is a single patch monitor.  [employer] supplies one patch monitor per enrollment.  Additional stickers are not available.   Please do not apply patch if you will be having a Nuclear Stress Test, Echocardiogram, Cardiac CT, MRI, or Chest Xray during the time frame you would be wearing the monitor. The patch cannot be worn during these tests.  You cannot remove and re-apply the ZIO XT patch monitor.   Your ZIO patch monitor will be sent USPS Priority mail from [facility] directly to your home address. The monitor may also be mailed to a PO BOX if home delivery is not available.   It may take 3-5 days to receive your monitor after you have been enrolled.   Once you have received you monitor, please review enclosed instructions.  Your monitor has already been registered  assigning a specific monitor serial # to you.   Applying the monitor   Shave hair from upper left chest.   Hold abrader disc by orange tab.  Rub abrader in 40 strokes over left upper chest as indicated in your monitor instructions.   Clean area with 4 enclosed alcohol pads .  Use all pads to assure are is cleaned thoroughly.  Let dry.   Apply patch as indicated in monitor instructions.  Patch will be place under collarbone on left side of chest with arrow pointing upward.   Rub patch adhesive wings for 2 minutes.Remove white label marked "1".  Remove white label marked "2".  Rub patch adhesive wings for 2 additional minutes.   While looking in a mirror, press and release button in center of patch.  A small green light will flash 3-4 times .  This will be your only indicator the monitor has been turned on.     Do not shower for the first 24 hours.  You may shower after the first 24 hours.   Press button if you feel a symptom. You will hear a small click.  Record Date, Time and Symptom in the Patient Log Book.   When you are ready to remove patch, follow instructions on last 2 pages of Patient Log Book.  Stick patch monitor onto last page of Patient Log Book.   Place Patient Log Book in [place] box.  Use locking tab on box and tape box closed securely.  The [employer] has [employer] on it.  Please place in mailbox as soon as possible.  Your physician should have your test results approximately 7 days after the monitor has been mailed back to [facility].   Call [place] at [phone] if you have questions regarding your ZIO XT patch monitor.  Call them immediately if you see an orange light blinking on your monitor.   If your monitor falls off in less than 4 days contact our Monitor department at [phone].  If your monitor becomes loose or falls off after 4 days call [employer] at [phone] for suggestions on securing your monitor.      Follow-Up: At [facility], you and your health needs are our priority.  As part of our continuing mission to provide you with exceptional heart care, we have created designated Provider Care Teams.  These Care Teams include your primary Cardiologist (physician) and Advanced Practice Providers (APPs -  Physician Assistants and Nurse Practitioners) who all work together to provide you with the care you need, when you need it.  We recommend signing up for the patient portal called "MyChart".  Sign up information is provided on this After Visit Summary.  MyChart is used to connect with patients for Virtual Visits (Telemedicine).  Patients are able to view lab/test results, encounter notes, upcoming appointments, etc.  Non-urgent messages can be sent to your provider as well.   To learn more about what you can do with MyChart, go to [email or web address].    Your next appointment:   3 month(s)  The format for your next appointment:   In Person  Provider:   Jenkins Rouge, MD   Other Instructions Thank you for choosing [facility]!

## 2020-10-18 ENCOUNTER — Ambulatory Visit (HOSPITAL_COMMUNITY)
Admission: RE | Admit: 2020-10-18 | Discharge: 2020-10-18 | Disposition: A | Discharge status: Home / Self Care | Payer: Medicare (Managed Care) | Source: Ambulatory Visit | Attending: Cardiovascular Disease | Admitting: Cardiovascular Disease

## 2020-10-18 ENCOUNTER — Other Ambulatory Visit: Payer: Self-pay

## 2020-10-18 DIAGNOSIS — R011 Cardiac murmur, unspecified: Secondary | ICD-10-CM

## 2020-10-18 DIAGNOSIS — R000 Tachycardia, unspecified: Secondary | ICD-10-CM | POA: Diagnosis not present

## 2020-10-18 LAB — ECHOCARDIOGRAM COMPLETE
AR max vel: 1.63 cm2
AV Area VTI: 1.66 cm2
AV Area mean vel: 1.46 cm2
AV Mean grad: 5.6 mmHg
AV Peak grad: 12.3 mmHg
Ao pk vel: 1.75 m/s
Area-P 1/2: 4.77 cm2
S' Lateral: 2.6 cm

## 2020-10-18 LAB — COLOGUARD: COLOGUARD: NEGATIVE

## 2020-10-18 LAB — EXTERNAL GENERIC LAB PROCEDURE: COLOGUARD: NEGATIVE

## 2020-10-18 NOTE — Progress Notes (Signed)
*  PRELIMINARY RESULTS* Echocardiogram 2D Echocardiogram has been performed.  Teresa Sweeney 10/18/2020, 12:02 PM

## 2020-12-07 ENCOUNTER — Telehealth (INDEPENDENT_AMBULATORY_CARE_PROVIDER_SITE_OTHER): Payer: Self-pay

## 2020-12-07 ENCOUNTER — Other Ambulatory Visit (INDEPENDENT_AMBULATORY_CARE_PROVIDER_SITE_OTHER): Payer: Self-pay

## 2020-12-07 ENCOUNTER — Encounter (INDEPENDENT_AMBULATORY_CARE_PROVIDER_SITE_OTHER): Payer: Self-pay

## 2020-12-07 ENCOUNTER — Ambulatory Visit (INDEPENDENT_AMBULATORY_CARE_PROVIDER_SITE_OTHER): Payer: Medicare (Managed Care) | Admitting: Gastroenterology

## 2020-12-07 ENCOUNTER — Other Ambulatory Visit: Payer: Self-pay

## 2020-12-07 ENCOUNTER — Encounter (INDEPENDENT_AMBULATORY_CARE_PROVIDER_SITE_OTHER): Payer: Self-pay | Admitting: Gastroenterology

## 2020-12-07 DIAGNOSIS — D649 Anemia, unspecified: Secondary | ICD-10-CM | POA: Insufficient documentation

## 2020-12-07 DIAGNOSIS — D509 Iron deficiency anemia, unspecified: Secondary | ICD-10-CM

## 2020-12-07 MED ORDER — PEG 3350-KCL-NA BICARB-NACL 420 G PO SOLR: 4000.0000 mL | ORAL | 0 refills | Status: DC

## 2020-12-07 NOTE — Telephone Encounter (Signed)
LeighAnn Javarus Dorner, CMA  

## 2020-12-07 NOTE — Progress Notes (Signed)
Maylon Peppers, M.D. Gastroenterology & Hepatology [facility] [address] [place], [place] [address] Primary Care Physician: [facility] [place] [address]  Referring MD: PCP Chief Complaint: Anemia  History of Present Illness: Teresa Sweeney is a 65 y.o. female with past medical history of anxiety, depression, diabetes, hyperlipidemia and hypertension, who presents for evaluation of microcytic anemia.  Patient denies any major complaints. She reports that she has had some episodes of pain in her upper abdomen, which has been present for multiple months but does not recall how long. It last for a few hours and resolves on its own, happens every few day. She reports the pain  is very mild and has not been concerning for her. The patient denies having any nausea, vomiting, fever, chills, hematochezia, melena, hematemesis, abdominal distention, diarrhea, jaundice, pruritus, hematuria, bloody vaginal discharge or weight loss.  Patient brings blood work-up from testing performed on 07/27/2020 which showed a hemoglobin of 11.4 with MCV of 75.4, CMP with borderline AST of 33 and ALT of 38, other liver enzymes and renal function were within normal limits.  Due to this, the patient was referred to our clinic for further evaluation.  Last JOA:CZYSA Last Colonoscopy:never  FHx: neg for any gastrointestinal/liver disease, brother had cancer but does not know which organ was the primary Social: quit smoking last year, neg alcohol or illicit drug use Surgical:hysterectomy  Past Medical History: Past Medical History:  Diagnosis Date   Anxiety    Depression    Diabetes mellitus without complication ([place])    Hyperlipemia    Hypertension     Past Surgical History: Past Surgical History:  Procedure Laterality Date   ABDOMINAL HYSTERECTOMY      Family History: Family History  Problem Relation  Age of Onset   Heart attack Mother    Diabetes Sister    Diabetes Other    Hypertension Other    Cancer Other     Social History: Social History   Tobacco Use  Smoking Status Former   Packs/day: 1.00   Types: Cigarettes  Smokeless Tobacco Never   Social History   Substance and Sexual Activity  Alcohol Use No   Social History   Substance and Sexual Activity  Drug Use No    Allergies: Allergies  Allergen Reactions   Lyrica [Pregabalin]     Made her afraid of the dark    Medications: Current Outpatient Medications  Medication Sig Dispense Refill   acetaminophen (TYLENOL 8 HOUR) 650 MG CR tablet Take 1 tablet (650 mg total) by mouth every 8 (eight) hours. 30 tablet 0   amLODipine (NORVASC) 10 MG tablet Take 10 mg by mouth daily.     aspirin EC 81 MG tablet Take 81 mg by mouth daily.     meclizine (ANTIVERT) 25 MG tablet Take 25 mg by mouth 3 (three) times daily as needed for dizziness.     metFORMIN (GLUCOPHAGE) 1000 MG tablet Take 1,000 mg by mouth 2 (two) times daily.     mirtazapine (REMERON) 15 MG tablet Take 15 mg by mouth at bedtime.     pravastatin (PRAVACHOL) 40 MG tablet Take 40 mg by mouth at bedtime.      QUEtiapine (SEROQUEL) 25 MG tablet Take 25 mg by mouth at bedtime.     metoprolol succinate (TOPROL-XL) 50 MG 24 hr tablet Take 1 tablet (50 mg total) by mouth daily. Take with or immediately following  a meal. 90 tablet 3   traZODone (DESYREL) 50 MG tablet Take 1 tablet (50 mg total) by mouth at bedtime. (Patient not taking: Reported on 12/07/2020) 7 tablet 0   zolpidem (AMBIEN) 5 MG tablet Take 1 tablet (5 mg total) by mouth at bedtime as needed for sleep. (Patient not taking: Reported on 12/07/2020) 10 tablet 0   No current facility-administered medications for this visit.    Review of Systems: GENERAL: negative for malaise, night sweats HEENT: No changes in hearing or vision, no nose bleeds or other nasal problems. NECK: Negative for lumps, goiter,  pain and significant neck swelling RESPIRATORY: Negative for cough, wheezing CARDIOVASCULAR: Negative for chest pain, leg swelling, palpitations, orthopnea GI: SEE HPI MUSCULOSKELETAL: Negative for joint pain or swelling, back pain, and muscle pain. SKIN: Negative for lesions, rash PSYCH: Negative for sleep disturbance, mood disorder and recent psychosocial stressors. HEMATOLOGY Negative for prolonged bleeding, bruising easily, and swollen nodes. ENDOCRINE: Negative for cold or heat intolerance, polyuria, polydipsia and goiter. NEURO: negative for tremor, gait imbalance, syncope and seizures. The remainder of the review of systems is noncontributory.   Physical Exam: BP 130/80 (BP Location: Right Arm, Patient Position: Sitting, Cuff Size: Large)   Pulse 88   Temp 98.2 F (36.8 C) (Oral)   Ht 5\' 1"  (1.549 m)   Wt 166 lb 9.6 oz (75.6 kg)   BMI 31.48 kg/m  GENERAL: The patient is AO x3, in no acute distress. HEENT: Head is normocephalic and atraumatic. EOMI are intact. Mouth is well hydrated and without lesions. NECK: Supple. No masses LUNGS: Clear to auscultation. No presence of rhonchi/wheezing/rales. Adequate chest expansion HEART: RRR, normal s1 and s2. ABDOMEN: Soft, nontender, no guarding, no peritoneal signs, and nondistended. BS +. No masses. EXTREMITIES: Without any cyanosis, clubbing, rash, lesions or edema. NEUROLOGIC: AOx3, no focal motor deficit. SKIN: no jaundice, no rashes   Imaging/Labs: as above  I personally reviewed and interpreted the available labs, imaging and endoscopic files.  Impression and Plan: Teresa Sweeney is a 65 y.o. female with past medical history of anxiety, depression, diabetes, hyperlipidemia and hypertension, who presents for evaluation of microcytic anemia.  The patient has not presented any symptoms of overt gastrointestinal bleeding and has not presented any other bleeding site.  However she was found to have mild anemia during recent  blood work-up of unclear source.  I had a thorough discussion with the patient regarding the need to characterize her anemia further with iron studies but since she has never had an EGD and colonoscopy, we will proceed with this to rule out any underlying ulcer or malignancy.  She understood and agreed.  - Schedule EGD and colonoscopy - Check iron studies  All questions were answered.      Maylon Peppers, MD Gastroenterology and Hepatology [facility]

## 2020-12-07 NOTE — Patient Instructions (Signed)
Schedule EGD and colonoscopy Perform blood workup

## 2020-12-08 ENCOUNTER — Encounter (INDEPENDENT_AMBULATORY_CARE_PROVIDER_SITE_OTHER): Payer: Self-pay

## 2020-12-08 LAB — IRON, TOTAL/TOTAL IRON BINDING CAP
%SAT: 15 % (calc) — ABNORMAL LOW (ref 16–45)
Iron: 57 ug/dL (ref 45–160)
TIBC: 382 mcg/dL (calc) (ref 250–450)

## 2020-12-08 LAB — FERRITIN: Ferritin: 40 ng/mL (ref 16–288)

## 2020-12-23 ENCOUNTER — Emergency Department (HOSPITAL_COMMUNITY): Payer: Medicare (Managed Care)

## 2020-12-23 ENCOUNTER — Emergency Department (HOSPITAL_COMMUNITY)
Admission: EM | Admit: 2020-12-23 | Discharge: 2020-12-23 | Disposition: A | Discharge status: Home / Self Care | Payer: Medicare (Managed Care) | Attending: Emergency Medicine | Admitting: Emergency Medicine

## 2020-12-23 ENCOUNTER — Encounter (HOSPITAL_COMMUNITY): Payer: Self-pay | Admitting: *Deleted

## 2020-12-23 DIAGNOSIS — Z87891 Personal history of nicotine dependence: Secondary | ICD-10-CM | POA: Diagnosis not present

## 2020-12-23 DIAGNOSIS — Z7984 Long term (current) use of oral hypoglycemic drugs: Secondary | ICD-10-CM | POA: Diagnosis not present

## 2020-12-23 DIAGNOSIS — R42 Dizziness and giddiness: Secondary | ICD-10-CM | POA: Diagnosis present

## 2020-12-23 DIAGNOSIS — R519 Headache, unspecified: Secondary | ICD-10-CM | POA: Diagnosis not present

## 2020-12-23 DIAGNOSIS — E1140 Type 2 diabetes mellitus with diabetic neuropathy, unspecified: Secondary | ICD-10-CM | POA: Insufficient documentation

## 2020-12-23 DIAGNOSIS — Z7982 Long term (current) use of aspirin: Secondary | ICD-10-CM | POA: Insufficient documentation

## 2020-12-23 DIAGNOSIS — E860 Dehydration: Secondary | ICD-10-CM | POA: Insufficient documentation

## 2020-12-23 DIAGNOSIS — Z79899 Other long term (current) drug therapy: Secondary | ICD-10-CM | POA: Insufficient documentation

## 2020-12-23 LAB — CBC WITH DIFFERENTIAL/PLATELET
Abs Immature Granulocytes: 0.01 10*3/uL (ref 0.00–0.07)
Basophils Absolute: 0.1 10*3/uL (ref 0.0–0.1)
Basophils Relative: 1 %
Eosinophils Absolute: 0.3 10*3/uL (ref 0.0–0.5)
Eosinophils Relative: 5 %
HCT: 36.9 % (ref 36.0–46.0)
Hemoglobin: 11.6 g/dL — ABNORMAL LOW (ref 12.0–15.0)
Immature Granulocytes: 0 %
Lymphocytes Relative: 23 %
Lymphs Abs: 1.2 10*3/uL (ref 0.7–4.0)
MCH: 25.1 pg — ABNORMAL LOW (ref 26.0–34.0)
MCHC: 31.4 g/dL (ref 30.0–36.0)
MCV: 79.9 fL — ABNORMAL LOW (ref 80.0–100.0)
Monocytes Absolute: 0.6 10*3/uL (ref 0.1–1.0)
Monocytes Relative: 12 %
Neutro Abs: 3.1 10*3/uL (ref 1.7–7.7)
Neutrophils Relative %: 59 %
Platelets: 246 10*3/uL (ref 150–400)
RBC: 4.62 MIL/uL (ref 3.87–5.11)
RDW: 16.7 % — ABNORMAL HIGH (ref 11.5–15.5)
WBC: 5.3 10*3/uL (ref 4.0–10.5)
nRBC: 0 % (ref 0.0–0.2)

## 2020-12-23 LAB — COMPREHENSIVE METABOLIC PANEL
ALT: 37 U/L (ref 0–44)
AST: 30 U/L (ref 15–41)
Albumin: 4.3 g/dL (ref 3.5–5.0)
Alkaline Phosphatase: 71 U/L (ref 38–126)
Anion gap: 11 (ref 5–15)
BUN: 26 mg/dL — ABNORMAL HIGH (ref 8–23)
CO2: 26 mmol/L (ref 22–32)
Calcium: 9.6 mg/dL (ref 8.9–10.3)
Chloride: 97 mmol/L — ABNORMAL LOW (ref 98–111)
Creatinine, Ser: 1.13 mg/dL — ABNORMAL HIGH (ref 0.44–1.00)
GFR, Estimated: 54 mL/min — ABNORMAL LOW (ref >60)
Glucose, Bld: 97 mg/dL (ref 70–99)
Potassium: 4 mmol/L (ref 3.5–5.1)
Sodium: 134 mmol/L — ABNORMAL LOW (ref 135–145)
Total Bilirubin: 0.2 mg/dL — ABNORMAL LOW (ref 0.3–1.2)
Total Protein: 8.7 g/dL — ABNORMAL HIGH (ref 6.5–8.1)

## 2020-12-23 MED ORDER — SODIUM CHLORIDE 0.9 % IV BOLUS
1000.0000 mL | Freq: Once | INTRAVENOUS | Status: AC
  Administered 2020-12-23: 1000 mL via INTRAVENOUS

## 2020-12-23 MED ORDER — BUTALBITAL-APAP-CAFFEINE 50-325-40 MG PO TABS
1.0000 | ORAL_TABLET | Freq: Once | ORAL | Status: AC
  Administered 2020-12-23: 1 via ORAL
  Filled 2020-12-23: qty 1

## 2020-12-23 NOTE — ED Provider Notes (Signed)
Emergency Medicine Provider Triage Evaluation Note  Teresa Sweeney , a 65 y.o. female  was evaluated in triage.  Pt complains of headache and dizziness. Symptoms began on Monday. States she has intermittent headache associated with dizziness. Tried "migraine medicine" and tylenol without relief of symptoms. Nothing makes it better or worse. Denies falls or trauma. Denies fever, photophobia or phonophobia, nausea or vomiting.   Review of Systems  Positive: Headache, dizziness  Negative: Fever, N/V  Physical Exam  BP (!) 146/78 (BP Location: Right Arm)   Pulse 100   Temp 98 F (36.7 C) (Oral)   Resp 16   Ht 5\' 1"  (1.549 m)   Wt 76.8 kg   SpO2 96%   BMI 31.99 kg/m  Gen:   Awake, no distress   Resp:  Normal effort  MSK:   Moves extremities without difficulty  Other:  Ptosis of the right eyelid. Otherwise no focal neurological findings. Cranial nerves intact. Strength equal in bilateral upper and lower extremities. Speech intact without dysarthria. No pronator drift.   Medical Decision Making  Medically screening exam initiated at 4:25 PM.  Appropriate orders placed.  Teresa Sweeney was informed that the remainder of the evaluation will be completed by another provider, this initial triage assessment does not replace that evaluation, and the importance of remaining in the ED until their evaluation is complete.     Teresa Hillier, PA-C 12/23/20 1627    Teresa Ferguson, MD 12/23/20 2258

## 2020-12-23 NOTE — ED Notes (Signed)
Patient transported to CT 

## 2020-12-23 NOTE — Discharge Instructions (Signed)
It appears that you were somewhat dehydrated by today's exam and lab work.  You have received IV fluids.  I recommend concentrating on adding more fluid to your daily intake as discussed.  Of course increasing your water intake, you may also want to consider other fluid choices such as Gatorade or Powerade or any other juice that you prefer, but make sure that you are picking sugar-free choices.  Your head CT is negative for any acute or chronic findings.  Plan to follow-up with your doctor for recheck within the next week if your symptoms return or worsen in any way.

## 2020-12-23 NOTE — ED Triage Notes (Signed)
States she has something going on with her head for the past week and cannot explain the symptoms

## 2021-01-05 NOTE — ED Provider Notes (Signed)
[facility] EMERGENCY DEPARTMENT Provider Note   CSN: [id number] Arrival date & time: 12/23/20  1506     History No chief complaint on file.   Teresa Sweeney is a 65 y.o. female with history of DM, HTN, hyperlipidemia presenting with a 5 day history of a mild frontal headache which has been intermittent, associated with lightheadedness, also intermittent, but usually triggered by standing from seated position.  She denies vertigo/room spinning.  She also denies n/v, fevers, photophobia which usually accompany her migraine headaches.  Also denies focal weakness, cp, sob, palpitation, abdominal pain, numbness, neck pain or stiffness, nasal or sinus congestion. She has taken excedrin migraine and tylenol with transient improvement in headache. Reports decreased PO intake this week, has not checked cbg's.   The history is provided by the patient.      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     Family History  Problem Relation Age of Onset   Heart attack Mother    Diabetes Sister    Diabetes Other    Hypertension Other    Cancer Other     Social History   Tobacco Use   Smoking status: Former    Packs/day: 1.00    Types: Cigarettes   Smokeless tobacco: Never  Vaping Use   Vaping Use: Never used  Substance Use Topics   Alcohol use: No   Drug use: No    Home Medications Prior to Admission medications   Medication Sig Start Date End Date  Taking? Authorizing Provider  acetaminophen (TYLENOL 8 HOUR) 650 MG CR tablet Take 1 tablet (650 mg total) by mouth every 8 (eight) hours. 01/12/19   Varney Biles, MD  amLODipine (NORVASC) 10 MG tablet Take 10 mg by mouth daily. 10/09/20   [provider]  aspirin EC 81 MG tablet Take 81 mg by mouth daily.    [provider]  meclizine (ANTIVERT) 25 MG tablet Take 25 mg by mouth 3 (three) times daily as needed for dizziness.    [provider]  metFORMIN (GLUCOPHAGE) 1000 MG tablet Take 1,000 mg by mouth 2 (two) times daily. 06/30/20   [provider]  metoprolol succinate (TOPROL-XL) 50 MG 24 hr tablet Take 1 tablet (50 mg total) by mouth daily. Take with or immediately following a meal. 09/26/20 12/25/20  Josue Hector, MD  mirtazapine (REMERON) 15 MG tablet Take 15 mg by mouth at bedtime. 07/06/19   [provider]  OVER THE COUNTER MEDICATION Take by mouth daily at 6 (six) AM. [place] Fe Multivitamin    [provider]  polyethylene glycol-electrolytes (TRILYTE) 420 g solution Take 4,000 mLs by mouth as directed. 12/07/20   Harvel Quale, MD  pravastatin (PRAVACHOL) 40 MG tablet Take 40 mg by mouth at bedtime.  09/22/15   [provider]  QUEtiapine (SEROQUEL) 25 MG tablet Take 25 mg by mouth at bedtime. 01/11/20  [provider]  traZODone (DESYREL) 50 MG tablet Take 1 tablet (50 mg total) by mouth at bedtime. Patient not taking: Reported on 12/07/2020 06/06/17   Lily Kocher, PA-C  zolpidem (AMBIEN) 5 MG tablet Take 1 tablet (5 mg total) by mouth at bedtime as needed for sleep. Patient not taking: Reported on 12/07/2020 06/14/17   Davonna Belling, MD    Allergies    Lyrica [pregabalin]  Review of Systems   Review of Systems  Constitutional:  Negative for chills and fever.  HENT:  Negative for congestion and sore throat.   Eyes: Negative.  Negative for photophobia and visual disturbance.   Respiratory:  Negative for chest tightness and shortness of breath.   Cardiovascular:  Negative for chest pain.  Gastrointestinal:  Negative for abdominal pain, nausea and vomiting.  Genitourinary: Negative.   Musculoskeletal:  Negative for arthralgias, joint swelling, neck pain and neck stiffness.  Skin: Negative.  Negative for rash and wound.  Neurological:  Positive for light-headedness and headaches. Negative for dizziness, speech difficulty, weakness and numbness.  Psychiatric/Behavioral: Negative.     Physical Exam Updated Vital Signs BP 140/85 (BP Location: Left Arm)   Pulse 90   Temp 98 F (36.7 C) (Oral)   Resp 18   Ht 5\' 1"  (1.549 m)   Wt 76.8 kg   SpO2 98%   BMI 31.99 kg/m   Physical Exam Vitals and nursing note reviewed.  Constitutional:      Appearance: She is well-developed.  HENT:     Head: Normocephalic and atraumatic.     Right Ear: Tympanic membrane normal.     Left Ear: Tympanic membrane normal.  Eyes:     Extraocular Movements: Extraocular movements intact.     Pupils: Pupils are equal, round, and reactive to light.  Cardiovascular:     Rate and Rhythm: Normal rate.     Heart sounds: Normal heart sounds.  Pulmonary:     Effort: Pulmonary effort is normal.  Abdominal:     Palpations: Abdomen is soft.     Tenderness: There is no abdominal tenderness.  Musculoskeletal:        General: Normal range of motion.     Cervical back: Normal range of motion and neck supple.  Lymphadenopathy:     Cervical: No cervical adenopathy.  Skin:    General: Skin is warm and dry.     Findings: No rash.  Neurological:     General: No focal deficit present.     Mental Status: She is alert and oriented to person, place, and time.     GCS: GCS eye subscore is 4. GCS verbal subscore is 5. GCS motor subscore is 6.     Cranial Nerves: No cranial nerve deficit.     Sensory: No sensory deficit.     Coordination: Coordination normal.     Gait: Gait normal.     Deep  Tendon Reflexes: Reflexes normal.     Comments: Normal heel-shin, normal rapid alternating movements. Cranial nerves III-XII intact.  No pronator drift.  Psychiatric:        Speech: Speech normal.        Behavior: Behavior normal.        Thought Content: Thought content normal.    ED Results / Procedures / Treatments   Labs (all labs ordered are listed, but only abnormal results are displayed) Labs Reviewed  COMPREHENSIVE METABOLIC PANEL - Abnormal; Notable for the following components:      Result Value  Sodium 134 (*)    Chloride 97 (*)    BUN 26 (*)    Creatinine, Ser 1.13 (*)    Total Protein 8.7 (*)    Total Bilirubin 0.2 (*)    GFR, Estimated 54 (*)    All other components within normal limits  CBC WITH DIFFERENTIAL/PLATELET - Abnormal; Notable for the following components:   Hemoglobin 11.6 (*)    MCV 79.9 (*)    MCH 25.1 (*)    RDW 16.7 (*)    All other components within normal limits    EKG None  Radiology No results found.  CT Head Wo Contrast  Result Date: 12/23/2020 CLINICAL DATA:  Headache. EXAM: CT HEAD WITHOUT CONTRAST TECHNIQUE: Contiguous axial images were obtained from the base of the skull through the vertex without intravenous contrast. COMPARISON:  April 11, 2016. FINDINGS: Brain: Mild chronic ischemic white matter disease is noted. No mass effect or midline shift is noted. Ventricular size is within normal limits. There is no evidence of mass lesion, hemorrhage or acute infarction. Vascular: No hyperdense vessel or unexpected calcification. Skull: Normal. Negative for fracture or focal lesion. Sinuses/Orbits: No acute finding. Other: None. IMPRESSION: No acute intracranial abnormality seen. Electronically Signed   By: Marijo Conception M.D.   On: 12/23/2020 18:01    Procedures Procedures   Medications Ordered in ED Medications  butalbital-acetaminophen-caffeine (FIORICET) 50-325-40 MG per tablet 1 tablet (1 tablet Oral Given 12/23/20 1704)   sodium chloride 0.9 % bolus 1,000 mL (0 mLs Intravenous Stopped 12/23/20 1858)    ED Course  I have reviewed the triage vital signs and the nursing notes.  Pertinent labs & imaging results that were available during my care of the patient were reviewed by me and considered in my medical decision making (see chart for details).    MDM Rules/Calculators/A&P                           Labs and imaging reviewed and reassuring, mild bump in creatinine and BUN.  Cbg normal range.  She was symptomatic with orthostatic VS.  Pt was given IV fluids, also was given a fioricet tablet early in ed course and felt improved at time of dc.  Discussed increasing fluid intake, plan f/u with pcp for recheck if sx return or persist. Final Clinical Impression(s) / ED Diagnoses Final diagnoses:  Dehydration    Rx / DC Orders ED Discharge Orders     None        Landis Martins 01/05/21 [place], MD 01/12/21 [phone]

## 2021-01-08 ENCOUNTER — Encounter (INDEPENDENT_AMBULATORY_CARE_PROVIDER_SITE_OTHER): Payer: Self-pay

## 2021-01-15 ENCOUNTER — Other Ambulatory Visit (INDEPENDENT_AMBULATORY_CARE_PROVIDER_SITE_OTHER): Payer: Self-pay

## 2021-01-15 ENCOUNTER — Encounter (INDEPENDENT_AMBULATORY_CARE_PROVIDER_SITE_OTHER): Payer: Self-pay

## 2021-01-17 ENCOUNTER — Encounter (HOSPITAL_COMMUNITY)
Admission: RE | Disposition: A | Discharge status: Home / Self Care | Payer: Self-pay | Source: Ambulatory Visit | Attending: Gastroenterology

## 2021-01-17 ENCOUNTER — Ambulatory Visit (HOSPITAL_COMMUNITY): Payer: Medicare (Managed Care) | Admitting: Anesthesiology

## 2021-01-17 ENCOUNTER — Encounter (HOSPITAL_COMMUNITY): Payer: Self-pay | Admitting: Gastroenterology

## 2021-01-17 ENCOUNTER — Ambulatory Visit (HOSPITAL_COMMUNITY)
Admission: RE | Admit: 2021-01-17 | Discharge: 2021-01-17 | Disposition: A | Discharge status: Home / Self Care | Payer: Medicare (Managed Care) | Source: Ambulatory Visit | Attending: Gastroenterology | Admitting: Gastroenterology

## 2021-01-17 ENCOUNTER — Other Ambulatory Visit: Payer: Self-pay

## 2021-01-17 DIAGNOSIS — D122 Benign neoplasm of ascending colon: Secondary | ICD-10-CM | POA: Insufficient documentation

## 2021-01-17 DIAGNOSIS — K5730 Diverticulosis of large intestine without perforation or abscess without bleeding: Secondary | ICD-10-CM | POA: Diagnosis not present

## 2021-01-17 DIAGNOSIS — Z87891 Personal history of nicotine dependence: Secondary | ICD-10-CM | POA: Diagnosis not present

## 2021-01-17 DIAGNOSIS — D120 Benign neoplasm of cecum: Secondary | ICD-10-CM | POA: Insufficient documentation

## 2021-01-17 DIAGNOSIS — E785 Hyperlipidemia, unspecified: Secondary | ICD-10-CM | POA: Diagnosis not present

## 2021-01-17 DIAGNOSIS — D509 Iron deficiency anemia, unspecified: Secondary | ICD-10-CM | POA: Diagnosis present

## 2021-01-17 DIAGNOSIS — K3189 Other diseases of stomach and duodenum: Secondary | ICD-10-CM | POA: Insufficient documentation

## 2021-01-17 DIAGNOSIS — D123 Benign neoplasm of transverse colon: Secondary | ICD-10-CM | POA: Insufficient documentation

## 2021-01-17 DIAGNOSIS — E119 Type 2 diabetes mellitus without complications: Secondary | ICD-10-CM | POA: Diagnosis not present

## 2021-01-17 DIAGNOSIS — I10 Essential (primary) hypertension: Secondary | ICD-10-CM | POA: Insufficient documentation

## 2021-01-17 DIAGNOSIS — D125 Benign neoplasm of sigmoid colon: Secondary | ICD-10-CM

## 2021-01-17 HISTORY — PX: POLYPECTOMY: SHX5525

## 2021-01-17 HISTORY — PX: BIOPSY: SHX5522

## 2021-01-17 HISTORY — PX: COLONOSCOPY WITH PROPOFOL: SHX5780

## 2021-01-17 HISTORY — PX: ESOPHAGOGASTRODUODENOSCOPY (EGD) WITH PROPOFOL: SHX5813

## 2021-01-17 LAB — GLUCOSE, CAPILLARY: Glucose-Capillary: 165 mg/dL — ABNORMAL HIGH (ref 70–99)

## 2021-01-17 LAB — HM COLONOSCOPY

## 2021-01-17 SURGERY — COLONOSCOPY WITH PROPOFOL
Anesthesia: General

## 2021-01-17 MED ORDER — OMEPRAZOLE 40 MG PO CPDR: 40.0000 mg | DELAYED_RELEASE_CAPSULE | Freq: Every day | ORAL | 1 refills | Status: DC

## 2021-01-17 MED ORDER — LACTATED RINGERS IV SOLN: INTRAVENOUS | Status: DC

## 2021-01-17 MED ORDER — PROPOFOL 500 MG/50ML IV EMUL
INTRAVENOUS | Status: DC | PRN
  Administered 2021-01-17: 150 ug/kg/min via INTRAVENOUS

## 2021-01-17 MED ORDER — PROPOFOL 10 MG/ML IV BOLUS
INTRAVENOUS | Status: DC | PRN
  Administered 2021-01-17: 40 mg via INTRAVENOUS
  Administered 2021-01-17: 100 mg via INTRAVENOUS
  Administered 2021-01-17: 50 mg via INTRAVENOUS
  Administered 2021-01-17: 60 mg via INTRAVENOUS

## 2021-01-17 MED ORDER — LIDOCAINE HCL (CARDIAC) PF 100 MG/5ML IV SOSY
PREFILLED_SYRINGE | INTRAVENOUS | Status: DC | PRN
  Administered 2021-01-17: 50 mg via INTRAVENOUS

## 2021-01-17 NOTE — H&P (Signed)
Teresa Sweeney is an 65 y.o. female.   Chief Complaint: iron def anemia HPI: Teresa Sweeney is a 65 y.o. female with past medical history of anxiety, depression, diabetes, hyperlipidemia and hypertension, who presents for evaluation of microcytic anemia.  Patient reports feeling well.  Has been taking her oral iron compliantly.  Denies any nausea, vomiting, fever, chills, melena or hematochezia.  Last hemoglobin on 12/23/2020 was 11.6.  Iron stores were checked on 12/07/20  showed a borderline iron saturation of 15% with rest of iron stores within normal limits.  Past Medical History:  Diagnosis Date   Anxiety    Depression    Diabetes mellitus without complication ([place])    Hyperlipemia    Hypertension     Past Surgical History:  Procedure Laterality Date   ABDOMINAL HYSTERECTOMY      Family History  Problem Relation Age of Onset   Heart attack Mother    Diabetes Sister    Diabetes Other    Hypertension Other    Cancer Other    Social History:  reports that she has quit smoking. Her smoking use included cigarettes. She smoked an average of 1 pack per day. She has never used smokeless tobacco. She reports that she does not drink alcohol and does not use drugs.  Allergies:  Allergies  Allergen Reactions   Lyrica [Pregabalin]     Made her afraid of the dark    Medications Prior to Admission  Medication Sig Dispense Refill   aspirin EC 81 MG tablet Take 81 mg by mouth daily.     hydrochlorothiazide (MICROZIDE) 12.5 MG capsule Take 12.5 mg by mouth daily.     metFORMIN (GLUCOPHAGE) 1000 MG tablet Take 1,000 mg by mouth 2 (two) times daily.     mirtazapine (REMERON) 15 MG tablet Take 15 mg by mouth at bedtime.     Pediatric Multivitamins-Iron (FLINTSTONES PLUS IRON PO) Take 1 tablet by mouth daily.     polyethylene glycol-electrolytes (TRILYTE) 420 g solution Take 4,000 mLs by mouth as directed. 4000 mL 0   pravastatin (PRAVACHOL) 40 MG tablet Take 40 mg by mouth at  bedtime.      QUEtiapine (SEROQUEL) 25 MG tablet Take 25 mg by mouth at bedtime.     vitamin B-12 (CYANOCOBALAMIN) 500 MCG tablet Take 500 mcg by mouth daily.     metoprolol succinate (TOPROL-XL) 50 MG 24 hr tablet Take 1 tablet (50 mg total) by mouth daily. Take with or immediately following a meal. (Patient not taking: Reported on 01/16/2021) 90 tablet 3   traZODone (DESYREL) 50 MG tablet Take 1 tablet (50 mg total) by mouth at bedtime. (Patient not taking: Reported on 12/07/2020) 7 tablet 0   zolpidem (AMBIEN) 5 MG tablet Take 1 tablet (5 mg total) by mouth at bedtime as needed for sleep. (Patient not taking: Reported on 12/07/2020) 10 tablet 0    Results for orders placed or performed during the hospital encounter of 01/17/21 (from the past 48 hour(s))  Glucose, capillary     Status: Abnormal   Collection Time: 01/17/21  8:30 AM  Result Value Ref Range   Glucose-Capillary 165 (H) 70 - 99 mg/dL    Comment: Glucose reference range applies only to samples taken after fasting for at least 8 hours.   No results found.  Review of Systems  Constitutional: Negative.   HENT: Negative.    Eyes: Negative.   Respiratory: Negative.    Cardiovascular: Negative.   Gastrointestinal: Negative.  Endocrine: Negative.   Genitourinary: Negative.   Musculoskeletal: Negative.   Skin: Negative.   Allergic/Immunologic: Negative.   Neurological: Negative.   Hematological: Negative.   Psychiatric/Behavioral: Negative.     Blood pressure 135/82, pulse (!) 115, temperature 98.2 F (36.8 C), temperature source Oral, resp. rate 19, height 5\' 1"  (1.549 m), weight 72.6 kg, SpO2 97 %. Physical Exam  GENERAL: The patient is AO x3, in no acute distress. HEENT: Head is normocephalic and atraumatic. EOMI are intact. Mouth is well hydrated and without lesions. NECK: Supple. No masses LUNGS: Clear to auscultation. No presence of rhonchi/wheezing/rales. Adequate chest expansion HEART: RRR, normal s1 and  s2. ABDOMEN: Soft, nontender, no guarding, no peritoneal signs, and nondistended. BS +. No masses. EXTREMITIES: Without any cyanosis, clubbing, rash, lesions or edema. NEUROLOGIC: AOx3, no focal motor deficit. SKIN: no jaundice, no rashes  Assessment/Plan Teresa Sweeney is a 65 y.o. female with past medical history of anxiety, depression, diabetes, hyperlipidemia and hypertension, who presents for evaluation of microcytic anemia.  We will proceed with EGD and colonoscopy.  Teresa Quale, MD 01/17/2021, 9:05 AM

## 2021-01-17 NOTE — Discharge Instructions (Addendum)
You are being discharged to home.  Resume your previous diet.  We are waiting for your pathology results.  Do not take any Goody/BC powders, diclofenac, high dose aspirin ibuprofen (including Advil, Motrin or Nuprin), naproxen, or other non-steroidal anti-inflammatory drugs.  Take Prilosec (omeprazole) 40 mg by mouth once a day.  Your physician has recommended a repeat colonoscopy for surveillance based on pathology results.

## 2021-01-17 NOTE — Anesthesia Postprocedure Evaluation (Signed)
Anesthesia Post Note  Patient: Teresa Sweeney  Procedure(s) Performed: COLONOSCOPY WITH PROPOFOL ESOPHAGOGASTRODUODENOSCOPY (EGD) WITH PROPOFOL BIOPSY POLYPECTOMY  Patient location during evaluation: Endoscopy Anesthesia Type: General Anesthetic complications: no Comments: Nursing staff discharged the patient as per protocol.   No notable events documented.   Last Vitals:  Vitals:   01/17/21 0828 01/17/21 1054  BP: 135/82 99/67  Pulse: (!) 115 (!) 104  Resp: 19 (!) 23  Temp: 36.8 C 36.6 C  SpO2: 97% 98%    Last Pain:  Vitals:   01/17/21 1054  TempSrc: Oral  PainSc: 0-No pain                 Omer Puccinelli C Candace Ramus

## 2021-01-17 NOTE — Anesthesia Procedure Notes (Signed)
Date/Time: 01/17/2021 10:01 AM Performed by: Orlie Dakin, CRNA Pre-anesthesia Checklist: Patient identified, Emergency Drugs available, Suction available and Patient being monitored Patient Re-evaluated:Patient Re-evaluated prior to induction Oxygen Delivery Method: Nasal cannula Induction Type: IV induction Placement Confirmation: positive ETCO2

## 2021-01-17 NOTE — Op Note (Signed)
[facility] Patient Name: Teresa Sweeney Procedure Date: 01/17/2021 9:48 AM MRN: [id number] Date of Birth: 19-Aug-1955 Attending MD: Maylon Peppers ,  CSN: [id number] Age: 65 Admit Type: Outpatient Procedure:                Colonoscopy Indications:              Iron deficiency anemia Providers:                Maylon Peppers, Lurline Del, RN, Casimer Bilis, Technician Referring MD:              Medicines:                Monitored Anesthesia Care Complications:            No immediate complications. Estimated Blood Loss:     Estimated blood loss: none. Procedure:                Pre-Anesthesia Assessment:                           - Prior to the procedure, a History and Physical                            was performed, and patient medications, allergies                            and sensitivities were reviewed. The patient's                            tolerance of previous anesthesia was reviewed.                           - The risks and benefits of the procedure and the                            sedation options and risks were discussed with the                            patient. All questions were answered and informed                            consent was obtained.                           - ASA Grade Assessment: II - A patient with mild                            systemic disease.                           After obtaining informed consent, the colonoscope                            was passed under direct vision. Throughout the  procedure, the patient's blood pressure, pulse, and                            oxygen saturations were monitored continuously. The                            [id number]-HQ190L ([id number]) scope was introduced through                            the anus and advanced to the the cecum, identified                            by appendiceal orifice and ileocecal valve. The                             colonoscopy was somewhat difficult due to a                            tortuous colon. Successful completion of the                            procedure was aided by applying abdominal pressure.                            The patient tolerated the procedure well. The                            quality of the bowel preparation was good. Scope In: 10:11:54 AM Scope Out: 10:49:49 AM Scope Withdrawal Time: 0 hours 27 minutes 13 seconds  Total Procedure Duration: 0 hours 37 minutes 55 seconds  Findings:      The perianal and digital rectal examinations were normal.      A 1 mm polyp was found in the cecum. The polyp was sessile. The polyp       was removed with a cold biopsy forceps. Resection and retrieval were       complete.      Five sessile polyps were found in the sigmoid colon, transverse colon       and ascending colon. The polyps were 3 to 6 mm in size. These polyps       were removed with a cold snare. Resection and retrieval were complete.      Multiple small and large-mouthed diverticula were found in the sigmoid       colon and descending colon.      The retroflexed view of the distal rectum and anal verge was normal and       showed no anal or rectal abnormalities. Impression:               - One 1 mm polyp in the cecum, removed with a cold                            biopsy forceps. Resected and retrieved.                           - Five 3 to 6 mm polyps in  the sigmoid colon, in                            the transverse colon and in the ascending colon,                            removed with a cold snare. Resected and retrieved.                           - Diverticulosis in the sigmoid colon and in the                            descending colon.                           - The distal rectum and anal verge are normal on                            retroflexion view. Moderate Sedation:      Per Anesthesia Care Recommendation:           - Discharge patient to home  (ambulatory).                           - Resume previous diet.                           - Await pathology results.                           - Repeat colonoscopy for surveillance based on                            pathology results. Procedure Code(s):        --- Professional ---                           [phone], Colonoscopy, flexible; with removal of                            tumor(s), polyp(s), or other lesion(s) by snare                            technique                           45380, 9, Colonoscopy, flexible; with biopsy,                            single or multiple Diagnosis Code(s):        --- Professional ---                           K63.5, Polyp of colon                           D50.9, Iron deficiency anemia, unspecified  K57.30, Diverticulosis of large intestine without                            perforation or abscess without bleeding CPT copyright 2019 American Medical Association. All rights reserved. The codes documented in this report are preliminary and upon coder review may  be revised to meet current compliance requirements. Maylon Peppers, MD Maylon Peppers,  01/17/2021 10:55:31 AM This report has been signed electronically. Number of Addenda: 0

## 2021-01-17 NOTE — Transfer of Care (Signed)
Immediate Anesthesia Transfer of Care Note  Patient: Teresa Sweeney  Procedure(s) Performed: COLONOSCOPY WITH PROPOFOL ESOPHAGOGASTRODUODENOSCOPY (EGD) WITH PROPOFOL BIOPSY POLYPECTOMY  Patient Location: Endoscopy Unit  Anesthesia Type:General  Level of Consciousness: drowsy  Airway & Oxygen Therapy: Patient Spontanous Breathing  Post-op Assessment: Report given to RN and Post -op Vital signs reviewed and stable  Post vital signs: Reviewed and stable  Last Vitals:  Vitals Value Taken Time  BP    Temp    Pulse    Resp    SpO2      Last Pain:  Vitals:   01/17/21 0828  TempSrc: Oral  PainSc: 0-No pain      Patients Stated Pain Goal: 6 (09/31/12 1624)  Complications: No notable events documented.

## 2021-01-17 NOTE — Anesthesia Preprocedure Evaluation (Addendum)
Anesthesia Evaluation  Patient identified by MRN, date of birth, ID band Patient awake    Reviewed: Allergy & Precautions, NPO status , Patient's Chart, lab work & pertinent test results, reviewed documented beta blocker date and time   History of Anesthesia Complications Negative for: history of anesthetic complications  Airway Mallampati: II  TM Distance: >3 FB Neck ROM: Full    Dental  (+) Dental Advisory Given, Missing, Chipped, Edentulous Upper   Pulmonary shortness of breath and with exertion, former smoker,    Pulmonary exam normal breath sounds clear to auscultation       Cardiovascular Exercise Tolerance: Good hypertension, Pt. on medications and Pt. on home beta blockers  Rhythm:Regular Rate:Tachycardia     Neuro/Psych PSYCHIATRIC DISORDERS Anxiety Depression  Neuromuscular disease (diabetic neuropathy)    GI/Hepatic negative GI ROS, Neg liver ROS,   Endo/Other  diabetes, Well Controlled, Type 2, Oral Hypoglycemic Agents  Renal/GU negative Renal ROS     Musculoskeletal negative musculoskeletal ROS (+)   Abdominal   Peds  Hematology  (+) anemia ,   Anesthesia Other Findings   Reproductive/Obstetrics negative OB ROS                            Anesthesia Physical Anesthesia Plan  ASA: 3  Anesthesia Plan: General   Post-op Pain Management: Minimal or no pain anticipated   Induction:   PONV Risk Score and Plan: TIVA  Airway Management Planned: Nasal Cannula and Natural Airway  Additional Equipment:   Intra-op Plan:   Post-operative Plan:   Informed Consent: I have reviewed the patients History and Physical, chart, labs and discussed the procedure including the risks, benefits and alternatives for the proposed anesthesia with the patient or authorized representative who has indicated his/her understanding and acceptance.     Dental advisory given  Plan Discussed  with: CRNA and Surgeon  Anesthesia Plan Comments:        Anesthesia Quick Evaluation

## 2021-01-17 NOTE — Op Note (Signed)
[facility] Patient Name: Teresa Sweeney Procedure Date: 01/17/2021 9:45 AM MRN: [id number] Date of Birth: [birth year] Attending MD: Maylon Peppers ,  CSN: [id number] Age: 65 Admit Type: Outpatient Procedure:                Upper GI endoscopy Indications:              Iron deficiency anemia Providers:                Maylon Peppers, Lurline Del, RN, Casimer Bilis, Technician Referring MD:              Medicines:                Monitored Anesthesia Care Complications:            No immediate complications. Estimated Blood Loss:     Estimated blood loss: none. Procedure:                Pre-Anesthesia Assessment:                           - Prior to the procedure, a History and Physical                            was performed, and patient medications, allergies                            and sensitivities were reviewed. The patient's                            tolerance of previous anesthesia was reviewed.                           - The risks and benefits of the procedure and the                            sedation options and risks were discussed with the                            patient. All questions were answered and informed                            consent was obtained.                           - ASA Grade Assessment: II - A patient with mild                            systemic disease.                           After obtaining informed consent, the endoscope was                            passed under direct vision. Throughout the  procedure, the patient's blood pressure, pulse, and                            oxygen saturations were monitored continuously. The                            [id number]-H190 ([id number]) scope was introduced through the                            mouth, and advanced to the second part of duodenum.                            The upper GI endoscopy was accomplished without                             difficulty. The patient tolerated the procedure                            well. Scope In: 9:59:20 AM Scope Out: 56:21:30 AM Total Procedure Duration: 0 hours 6 minutes 47 seconds  Findings:      The Z-line was regular and was found 37 cm from the incisors.      The examined esophagus was normal.      Multiple localized small erosions with no stigmata of recent bleeding       were found in the entire examined stomach. Biopsies from the body and       antrum were taken with a cold forceps for Helicobacter pylori testing.      The examined duodenum was normal. Biopsies were taken with a cold       forceps for histology. Impression:               - Z-line regular, 37 cm from the incisors.                           - Normal esophagus.                           - Erosive gastropathy with no stigmata of recent                            bleeding. Biopsied.                           - Normal examined duodenum. Biopsied. Moderate Sedation:      Per Anesthesia Care Recommendation:           - Discharge patient to home (ambulatory).                           - Resume previous diet.                           - Await pathology results.                           - No Goody/BC powders ibuprofen, naproxen,  diclofenac, high dose aspirin or other                            non-steroidal anti-inflammatory drugs.                           - Use Prilosec (omeprazole) 40 mg PO daily. Procedure Code(s):        --- Professional ---                           [phone], Esophagogastroduodenoscopy, flexible,                            transoral; with biopsy, single or multiple Diagnosis Code(s):        --- Professional ---                           K31.89, Other diseases of stomach and duodenum                           D50.9, Iron deficiency anemia, unspecified CPT copyright 2019 American Medical Association. All rights reserved. The codes documented in this report are  preliminary and upon coder review may  be revised to meet current compliance requirements. Maylon Peppers, MD Maylon Peppers,  01/17/2021 10:11:07 AM This report has been signed electronically. Number of Addenda: 0

## 2021-01-19 LAB — SURGICAL PATHOLOGY

## 2021-01-22 ENCOUNTER — Encounter (INDEPENDENT_AMBULATORY_CARE_PROVIDER_SITE_OTHER): Payer: Self-pay | Admitting: *Deleted

## 2021-01-22 ENCOUNTER — Encounter (HOSPITAL_COMMUNITY): Payer: Self-pay | Admitting: Gastroenterology

## 2021-01-29 NOTE — Progress Notes (Signed)
CARDIOLOGY CONSULT NOTE       Patient ID: Teresa Sweeney MRN: [id number] DOB/AGE: 67-24-1957 67 y.o.  Admit date: (Not on file) Referring Physician: Talbert Primary Physician: Alfonse Flavors, MD Primary Cardiologist: New Reason for Consultation: Pre syncope  Active Problems:   * No active hospital problems. *   HPI:  65 y.o. referred by Dr Earma Reading 09/26/20  [facility] for pre syncopal spells She has a history of HTN, HLD and DM as well as anxiety Seen in [facility] ED 02/03/20 with dizziness ECG/Tele with some sinus tachycardia but no arrhythmia Labs showed normal d dimer, Hct, TSH and negative troponin She was mildly azotemic with BUN 29 and Cr 1.19 She was on a diuretic for HTN She was also taking seroquel, remeron , trazodone and ambien   Labs 07/27/20 A1c 6.8 Hct 34.7   She really has no complaints for me today She said issues were related to headaches that are better now   We stopped her diuretic/K and norvasc in August and started her on beta blocker F/U echo and monitor were benign see below Average HR on monitor 97 bpm with nocturnal Dip normal.   Had EGD and colon for her iron deficiency anemia 01/17/21 showing erosive gastropathy and multiple polyps snared    She stopped her Toprol due to diarrhea She was put on HCTZ by her primary     ROS All other systems reviewed and negative except as noted above  Past Medical History:  Diagnosis Date   Anxiety    Depression    Diabetes mellitus without complication (HCC)    Hyperlipemia    Hypertension     Family History  Problem Relation Age of Onset   Heart attack Mother    Diabetes Sister    Diabetes Other    Hypertension Other    Cancer Other     Social History   Socioeconomic History   Marital status: Single    Spouse name: Not on file   Number of children: Not on file   Years of education: Not on file   Highest education level: Not on file  Occupational History   Not on file  Tobacco  Use   Smoking status: Former    Packs/day: 1.00    Types: Cigarettes   Smokeless tobacco: Never  Vaping Use   Vaping Use: Never used  Substance and Sexual Activity   Alcohol use: No   Drug use: No   Sexual activity: Not on file  Other Topics Concern   Not on file  Social History Narrative   Not on file   Social Determinants of Health   Financial Resource Strain: Not on file  Food Insecurity: Not on file  Transportation Needs: Not on file  Physical Activity: Not on file  Stress: Not on file  Social Connections: Not on file  Intimate Partner Violence: Not on file    Past Surgical History:  Procedure Laterality Date   ABDOMINAL HYSTERECTOMY     BIOPSY  01/17/2021   Procedure: BIOPSY;  Surgeon: Harvel Quale, MD;  Location: [facility];  Service: Gastroenterology;;  small, intestine, antral, body(gastric);   COLONOSCOPY WITH PROPOFOL N/A 01/17/2021   Procedure: COLONOSCOPY WITH PROPOFOL;  Surgeon: Harvel Quale, MD;  Location: [facility];  Service: Gastroenterology;  Laterality: N/A;  7:30   ESOPHAGOGASTRODUODENOSCOPY (EGD) WITH PROPOFOL N/A 01/17/2021   Procedure: ESOPHAGOGASTRODUODENOSCOPY (EGD) WITH PROPOFOL;  Surgeon: Harvel Quale, MD;  Location: [facility];  Service: Gastroenterology;  Laterality: N/A;   POLYPECTOMY  01/17/2021   Procedure: POLYPECTOMY;  Surgeon: Montez Morita, Quillian Quince, MD;  Location: [facility];  Service: Gastroenterology;;  ascendingx3; cecal ;sigmoid      Current Outpatient Medications:    aspirin EC 81 MG tablet, Take 81 mg by mouth daily., Disp: , Rfl:    hydrochlorothiazide (MICROZIDE) 12.5 MG capsule, Take 12.5 mg by mouth daily., Disp: , Rfl:    metFORMIN (GLUCOPHAGE) 1000 MG tablet, Take 1,000 mg by mouth 2 (two) times daily., Disp: , Rfl:    mirtazapine (REMERON) 15 MG tablet, Take 15 mg by mouth at bedtime., Disp: , Rfl:    omeprazole (PRILOSEC) 40 MG capsule, Take 1 capsule (40 mg total) by  mouth daily., Disp: 90 capsule, Rfl: 1   Pediatric Multivitamins-Iron (FLINTSTONES PLUS IRON PO), Take 1 tablet by mouth daily., Disp: , Rfl:    pravastatin (PRAVACHOL) 40 MG tablet, Take 40 mg by mouth at bedtime. , Disp: , Rfl:    QUEtiapine (SEROQUEL) 25 MG tablet, Take 25 mg by mouth at bedtime., Disp: , Rfl:    traZODone (DESYREL) 50 MG tablet, Take 1 tablet (50 mg total) by mouth at bedtime., Disp: 7 tablet, Rfl: 0   vitamin B-12 (CYANOCOBALAMIN) 500 MCG tablet, Take 500 mcg by mouth daily., Disp: , Rfl:    zolpidem (AMBIEN) 5 MG tablet, Take 1 tablet (5 mg total) by mouth at bedtime as needed for sleep., Disp: 10 tablet, Rfl: 0   metoprolol succinate (TOPROL-XL) 50 MG 24 hr tablet, Take 1 tablet (50 mg total) by mouth daily. Take with or immediately following a meal. (Patient not taking: Reported on 01/16/2021), Disp: 90 tablet, Rfl: 3    Physical Exam: Blood pressure 126/72, pulse (!) 106, height 5\' 2"  (1.575 m), weight 169 lb (76.7 kg), SpO2 97 %.    Affect appropriate Healthy:  appears stated age 65: normal Neck supple with no adenopathy JVP normal no bruits no thyromegaly Lungs clear with no wheezing and good diaphragmatic motion Heart:  S1/S2 2/6/SEM  murmur, no rub, gallop or click PMI normal Abdomen: benighn, BS positve, no tenderness, no AAA no bruit.  No HSM or HJR Distal pulses intact with no bruits No edema Neuro non-focal Skin warm and dry No muscular weakness   Labs:   Lab Results  Component Value Date   WBC 5.3 12/23/2020   HGB 11.6 (L) 12/23/2020   HCT 36.9 12/23/2020   MCV 79.9 (L) 12/23/2020   PLT 246 12/23/2020   No results for input(s): NA, K, CL, CO2, BUN, CREATININE, CALCIUM, PROT, BILITOT, ALKPHOS, ALT, AST, GLUCOSE in the last 168 hours.  Invalid input(s): LABALBU Lab Results  Component Value Date   CHENIDP 824 04/11/2016   TROPONINI <0.03 09/28/2015    Lab Results  Component Value Date   CHOL 181 04/12/2016   Lab Results   Component Value Date   HDL 56 04/12/2016   Lab Results  Component Value Date   LDLCALC 102 (H) 04/12/2016   Lab Results  Component Value Date   TRIG 116 04/12/2016   Lab Results  Component Value Date   CHOLHDL 3.2 04/12/2016   No results found for: LDLDIRECT    Radiology: No results found.  EKG: 02/04/20 ST rate 107 nonspecific ST changes 01/31/2021 ST rate 108 nonspecific ST changes 01/31/2021 ST rate 108 nonspecific ST changes    ASSESSMENT AND PLAN:   Pre syncope:  in setting of diuretic use and azotemia on  labs as well as multiple psycho active meds ECG not high risk  Normal echo EF 60-65% Not clear why her primary put her back on diuretic given above issues  And monitor 09/26/20 no significant arrhythmia and normal nocturnal drop in HR  HTN:  Avoid diuretic with azotemia continue beta blocker  HLD:  continue statin  DM:  Discussed low carb diet.  Target hemoglobin A1c is 6.5 or less.  Continue current medications. 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

## 2021-01-31 ENCOUNTER — Ambulatory Visit (INDEPENDENT_AMBULATORY_CARE_PROVIDER_SITE_OTHER): Payer: Medicare (Managed Care) | Admitting: Cardiovascular Disease

## 2021-01-31 ENCOUNTER — Encounter: Payer: Self-pay | Admitting: Cardiovascular Disease

## 2021-01-31 ENCOUNTER — Other Ambulatory Visit: Payer: Self-pay

## 2021-01-31 VITALS — BP 126/72 | HR 106 | Ht 62.0 in | Wt 169.0 lb

## 2021-01-31 DIAGNOSIS — I10 Essential (primary) hypertension: Secondary | ICD-10-CM | POA: Diagnosis not present

## 2021-01-31 DIAGNOSIS — R55 Syncope and collapse: Secondary | ICD-10-CM

## 2021-01-31 DIAGNOSIS — R000 Tachycardia, unspecified: Secondary | ICD-10-CM

## 2021-01-31 MED ORDER — ATENOLOL 25 MG PO TABS: 25.0000 mg | ORAL_TABLET | Freq: Every day | ORAL | 3 refills | Status: DC

## 2021-01-31 NOTE — Patient Instructions (Signed)
Medication Instructions:   Take Atenolol 25 mg Daily  Stop Taking Toprol XL   *If you need a refill on your cardiac medications before your next appointment, please call your pharmacy*   Lab Work: NONE   If you have labs (blood work) drawn today and your tests are completely normal, you will receive your results only by: [place] (if you have MyChart) OR A paper copy in the mail If you have any lab test that is abnormal or we need to change your treatment, we will call you to review the results.   Testing/Procedures: NONE    Follow-Up: At [facility], you and your health needs are our priority.  As part of our continuing mission to provide you with exceptional heart care, we have created designated Provider Care Teams.  These Care Teams include your primary Cardiologist (physician) and Advanced Practice Providers (APPs -  Physician Assistants and Nurse Practitioners) who all work together to provide you with the care you need, when you need it.  We recommend signing up for the patient portal called "MyChart".  Sign up information is provided on this After Visit Summary.  MyChart is used to connect with patients for Virtual Visits (Telemedicine).  Patients are able to view lab/test results, encounter notes, upcoming appointments, etc.  Non-urgent messages can be sent to your provider as well.   To learn more about what you can do with MyChart, go to [email or web address].    Your next appointment:   6 month(s)  The format for your next appointment:   In Person  Provider:   Jenkins Rouge, MD    Other Instructions Thank you for choosing [facility]!

## 2021-03-14 ENCOUNTER — Encounter (HOSPITAL_COMMUNITY): Payer: Self-pay

## 2021-03-14 ENCOUNTER — Emergency Department (HOSPITAL_COMMUNITY)
Admission: EM | Admit: 2021-03-14 | Discharge: 2021-03-14 | Disposition: A | Discharge status: Home / Self Care | Payer: Medicare (Managed Care) | Attending: Emergency Medicine | Admitting: Emergency Medicine

## 2021-03-14 ENCOUNTER — Other Ambulatory Visit: Payer: Self-pay

## 2021-03-14 DIAGNOSIS — Z7982 Long term (current) use of aspirin: Secondary | ICD-10-CM | POA: Diagnosis not present

## 2021-03-14 DIAGNOSIS — Z79899 Other long term (current) drug therapy: Secondary | ICD-10-CM | POA: Diagnosis not present

## 2021-03-14 DIAGNOSIS — I10 Essential (primary) hypertension: Secondary | ICD-10-CM | POA: Diagnosis present

## 2021-03-14 LAB — CBC WITH DIFFERENTIAL/PLATELET
Abs Immature Granulocytes: 0.04 10*3/uL (ref 0.00–0.07)
Basophils Absolute: 0.1 10*3/uL (ref 0.0–0.1)
Basophils Relative: 1 %
Eosinophils Absolute: 0.3 10*3/uL (ref 0.0–0.5)
Eosinophils Relative: 6 %
HCT: 38.6 % (ref 36.0–46.0)
Hemoglobin: 11.6 g/dL — ABNORMAL LOW (ref 12.0–15.0)
Immature Granulocytes: 1 %
Lymphocytes Relative: 24 %
Lymphs Abs: 1.2 10*3/uL (ref 0.7–4.0)
MCH: 24.8 pg — ABNORMAL LOW (ref 26.0–34.0)
MCHC: 30.1 g/dL (ref 30.0–36.0)
MCV: 82.7 fL (ref 80.0–100.0)
Monocytes Absolute: 0.6 10*3/uL (ref 0.1–1.0)
Monocytes Relative: 12 %
Neutro Abs: 2.8 10*3/uL (ref 1.7–7.7)
Neutrophils Relative %: 56 %
Platelets: 234 10*3/uL (ref 150–400)
RBC: 4.67 MIL/uL (ref 3.87–5.11)
RDW: 15.9 % — ABNORMAL HIGH (ref 11.5–15.5)
WBC: 5.1 10*3/uL (ref 4.0–10.5)
nRBC: 0 % (ref 0.0–0.2)

## 2021-03-14 LAB — BASIC METABOLIC PANEL
Anion gap: 10 (ref 5–15)
BUN: 21 mg/dL (ref 8–23)
CO2: 23 mmol/L (ref 22–32)
Calcium: 9.3 mg/dL (ref 8.9–10.3)
Chloride: 103 mmol/L (ref 98–111)
Creatinine, Ser: 1.09 mg/dL — ABNORMAL HIGH (ref 0.44–1.00)
GFR, Estimated: 56 mL/min — ABNORMAL LOW (ref >60)
Glucose, Bld: 152 mg/dL — ABNORMAL HIGH (ref 70–99)
Potassium: 4.1 mmol/L (ref 3.5–5.1)
Sodium: 136 mmol/L (ref 135–145)

## 2021-03-14 NOTE — ED Provider Notes (Signed)
[facility] EMERGENCY DEPARTMENT Provider Note   CSN: [id number] Arrival date & time: 03/14/21  1206     History  Chief Complaint  Patient presents with   Hypertension    Teresa Sweeney is a 66 y.o. female.  Patient has a history of hypertension.  She is supposedly taking hydrochlorothiazide and Lopressor.  She takes that in the morning.  Patient states that she felt kind of bad this morning and her blood pressure was elevated.  She checked it before she took her medicines  The history is provided by the patient and a relative. No language interpreter was used.  Hypertension This is a recurrent problem. The current episode started 12 to 24 hours ago. The problem occurs constantly. The problem has not changed since onset.Pertinent negatives include no chest pain, no abdominal pain and no headaches. Nothing aggravates the symptoms. Nothing relieves the symptoms. She has tried nothing for the symptoms.      Home Medications Prior to Admission medications   Medication Sig Start Date End Date Taking? Authorizing Provider  aspirin EC 81 MG tablet Take 81 mg by mouth daily.   Yes [provider]  atenolol (TENORMIN) 25 MG tablet Take 1 tablet (25 mg total) by mouth daily. 01/31/21 05/01/21 Yes Josue Hector, MD  hydrochlorothiazide (MICROZIDE) 12.5 MG capsule Take 12.5 mg by mouth daily.   Yes [provider]  metFORMIN (GLUCOPHAGE) 1000 MG tablet Take 1,000 mg by mouth 2 (two) times daily. 06/30/20  Yes [provider]  mirtazapine (REMERON) 15 MG tablet Take 15 mg by mouth at bedtime. 07/06/19  Yes [provider]  omeprazole (PRILOSEC) 40 MG capsule Take 1 capsule (40 mg total) by mouth daily. 01/17/21  Yes Harvel Quale, MD  Pediatric Multivitamins-Iron (FLINTSTONES PLUS IRON PO) Take 1 tablet by mouth daily.   Yes [provider]  pravastatin (PRAVACHOL) 40 MG tablet Take 40 mg by mouth at bedtime.  09/22/15  Yes [provider]  QUEtiapine (SEROQUEL) 25 MG tablet Take 25 mg by mouth at bedtime. 01/11/20  Yes [provider]  traZODone (DESYREL) 50 MG tablet Take 1 tablet (50 mg total) by mouth at bedtime. 06/06/17  Yes Lily Kocher, PA-C  vitamin B-12 (CYANOCOBALAMIN) 500 MCG tablet Take 500 mcg by mouth daily.   Yes [provider]  zolpidem (AMBIEN) 5 MG tablet Take 1 tablet (5 mg total) by mouth at bedtime as needed for sleep. 06/14/17  Yes Davonna Belling, MD      Allergies    Lyrica [pregabalin]    Review of Systems   Review of Systems  Constitutional:  Positive for fatigue. Negative for appetite change.  HENT:  Negative for congestion, ear discharge and sinus pressure.   Eyes:  Negative for discharge.  Respiratory:  Negative for cough.   Cardiovascular:  Negative for chest pain.  Gastrointestinal:  Negative for abdominal pain and diarrhea.  Genitourinary:  Negative for frequency and hematuria.  Musculoskeletal:  Negative for back pain.  Skin:  Negative for rash.  Neurological:  Negative for seizures and headaches.  Psychiatric/Behavioral:  Negative for hallucinations.    Physical Exam Updated Vital Signs BP (!) 147/86    Pulse 72    Temp 98.1 F (36.7 C) (Oral)    Resp 19    Ht 5\' 2"  (1.575 m)    Wt 73.9 kg    SpO2 98%    BMI 29.81 kg/m  Physical Exam Vitals and nursing note reviewed.  Constitutional:      Appearance: She is well-developed.  HENT:     Head: Normocephalic.     Nose: Nose normal.  Eyes:     General: No scleral icterus.    Conjunctiva/sclera: Conjunctivae normal.  Neck:     Thyroid: No thyromegaly.  Cardiovascular:     Rate and Rhythm: Normal rate and regular rhythm.     Heart sounds: No murmur heard.   No friction rub. No gallop.  Pulmonary:     Breath sounds: No stridor. No wheezing or rales.  Chest:     Chest wall: No tenderness.  Abdominal:     General: There is no distension.     Tenderness: There is no abdominal tenderness. There  is no rebound.  Musculoskeletal:        General: Normal range of motion.     Cervical back: Neck supple.  Lymphadenopathy:     Cervical: No cervical adenopathy.  Skin:    Findings: No erythema or rash.  Neurological:     Mental Status: She is alert and oriented to person, place, and time.     Motor: No abnormal muscle tone.     Coordination: Coordination normal.  Psychiatric:        Behavior: Behavior normal.    ED Results / Procedures / Treatments   Labs (all labs ordered are listed, but only abnormal results are displayed) Labs Reviewed  CBC WITH DIFFERENTIAL/PLATELET - Abnormal; Notable for the following components:      Result Value   Hemoglobin 11.6 (*)    MCH 24.8 (*)    RDW 15.9 (*)    All other components within normal limits  BASIC METABOLIC PANEL - Abnormal; Notable for the following components:   Glucose, Bld 152 (*)    Creatinine, Ser 1.09 (*)    GFR, Estimated 56 (*)    All other components within normal limits    EKG EKG Interpretation  Date/Time:  Wednesday March 14 2021 13:41:40 EST Ventricular Rate:  69 PR Interval:  112 QRS Duration: 83 QT Interval:  384 QTC Calculation: 412 R Axis:   5 Text Interpretation: Sinus rhythm Borderline short PR interval Confirmed by Milton Ferguson [phone]) on 03/14/2021 3:04:49 PM  Radiology No results found.  Procedures Procedures    Medications Ordered in ED Medications - No data to display  ED Course/ Medical Decision Making/ A&P                           Medical Decision Making Amount and/or Complexity of Data Reviewed Labs: ordered. ECG/medicine tests: ordered.   Patient with hypertension.  Labs unremarkable here and her blood pressure has been controlled in the emergency department.  She has been instructed to check her blood pressure daily and see her family doctor in 1 to 2-week  This patient presents to the ED for concern of mild weakness and high blood pressure, this involves an extensive number  of treatment options, and is a complaint that carries with it a high risk of complications and morbidity.  The differential diagnosis includes poorly controlled blood pressure, virus,   Co morbidities that complicate the patient evaluation  Hypertension   Additional history obtained:  Additional history obtained from friend External records from outside source obtained and reviewed including hospital records   Lab Tests:  I Ordered, and personally interpreted labs.  The pertinent results include: Glucose mildly elevated 152   Imaging Studies ordered: No imaging  Cardiac Monitoring:  The patient was maintained on a cardiac monitor.  I personally viewed and interpreted the cardiac monitored which showed an underlying rhythm of: Normal sinus rhythm   Medicines ordered and prescription drug management:  I ordered medication including.  No medicines given Reevaluation of the patient after these medicines showed that the patient improved I have reviewed the patients home medicines and have made adjustments as needed   Test Considered:  Chest x-ray   Critical Interventions:  None   Consultations Obtained:  I know consult  Problem List / ED Course: Hypertension   Reevaluation:  After the interventions noted above, I reevaluated the patient and found that they have :improved   Social Determinants of Health:  None   Dispostion:  After consideration of the diagnostic results and the patients response to treatment, I feel that the patent would benefit from discharge home with frequent taking her blood pressures every morning and follow-up with PCP.          Final Clinical Impression(s) / ED Diagnoses Final diagnoses:  Primary hypertension    Rx / DC Orders ED Discharge Orders     None         Milton Ferguson, MD 03/15/21 1101

## 2021-03-14 NOTE — ED Notes (Signed)
Pt verbalized understanding of discharge paperwork.

## 2021-03-14 NOTE — Discharge Instructions (Signed)
Record your blood pressure once a day in the morning and follow-up with your doctor in 1 to 2 weeks for recheck

## 2021-03-14 NOTE — ED Triage Notes (Signed)
Pt presents to ED with complaints of hypertension of 200/100. Pt c/o headache started this morning. Pt states she hasn't missed any doses of her BP meds

## 2021-05-14 ENCOUNTER — Other Ambulatory Visit: Payer: Self-pay

## 2021-05-14 ENCOUNTER — Ambulatory Visit (INDEPENDENT_AMBULATORY_CARE_PROVIDER_SITE_OTHER): Payer: Medicare (Managed Care) | Admitting: Gastroenterology

## 2021-05-14 ENCOUNTER — Encounter (INDEPENDENT_AMBULATORY_CARE_PROVIDER_SITE_OTHER): Payer: Self-pay | Admitting: Gastroenterology

## 2021-05-14 VITALS — BP 146/77 | HR 88 | Temp 97.6°F | Ht 62.0 in | Wt 172.5 lb

## 2021-05-14 DIAGNOSIS — D509 Iron deficiency anemia, unspecified: Secondary | ICD-10-CM | POA: Diagnosis not present

## 2021-05-14 NOTE — Patient Instructions (Signed)
Perform blood workup ?Continue Flintstones multivitamins daily indefinitely ?

## 2021-05-14 NOTE — Progress Notes (Signed)
Maylon Peppers, M.D. ?Gastroenterology & Hepatology ?[facility] ?[address] ?[place], [place] [address] ? ?Primary Care Physician: ?[facility] ?[place] [id number] ? ?I will communicate my assessment and recommendations to the referring MD via EMR. ? ?Problems: ?Iron deficiency anemia ? ?History of Present Illness: ?Teresa Sweeney is a 66 y.o. female with past medical history of anxiety, depression, diabetes, IDA, hyperlipidemia and hypertension,  who presents for follow up of iron deficiency anemia. ? ?The patient was last seen on 12/07/2020. At that time, the patient was found to have mildly decreased iron saturation was scheduled for both an EGD and a colonoscopy.  These procedures were performed on 01/17/2021.  Esophagogastroduodenospy showed normal esophagus, few erosions in the stomach (pathology was negative for H. pylori), normal duodenum (normal small bowel).  Colonoscopy showed presence of 6 polyps between 1 to 6 mm in size located in the cecum, ascending, transverse and sigmoid colon which were removed (5 out of 6 polyps were tubular adenomas, one was hyperplastic polyp), diverticulosis.  Recommend to have repeat colonoscopy in 3 years. She was advised to start omeprazole 40 mg every day and avoid NSAIDs.   ? ?Patient states feeling well. Has been taking Flintstones multivitamins daily. Has tolerated this adequately.The patient denies having any nausea, vomiting, fever, chills, hematochezia, melena, hematemesis, abdominal distention, abdominal pain, diarrhea, jaundice, pruritus or weight loss. ? ?Last EGD: as above ?Last Colonoscopy: as above ? ?Past Medical History: ?Past Medical History:  ?Diagnosis Date  ? Anxiety   ? Depression   ? Diabetes mellitus without complication ([facility])   ? Hyperlipemia   ? Hypertension   ? ? ?Past Surgical History: ?Past Surgical History:  ?Procedure Laterality Date  ?  ABDOMINAL HYSTERECTOMY    ? BIOPSY  01/17/2021  ? Procedure: BIOPSY;  Surgeon: Montez Morita, Quillian Quince, MD;  [facility]: [facility];  Service: Gastroenterology;;  small, intestine, antral, body(gastric);  ? COLONOSCOPY WITH PROPOFOL N/A 01/17/2021  ? Procedure: COLONOSCOPY WITH PROPOFOL;  Surgeon: Harvel Quale, MD;  Location: [facility];  Service: Gastroenterology;  Laterality: N/A;  7:30  ? ESOPHAGOGASTRODUODENOSCOPY (EGD) WITH PROPOFOL N/A 01/17/2021  ? Procedure: ESOPHAGOGASTRODUODENOSCOPY (EGD) WITH PROPOFOL;  Surgeon: Harvel Quale, MD;  [facility];  Service: Gastroenterology;  Laterality: N/A;  ? POLYPECTOMY  01/17/2021  ? Procedure: POLYPECTOMY;  Surgeon: Montez Morita, Quillian Quince, MD;  Location: [facility];  Service: Gastroenterology;;  ascendingx3; cecal ;sigmoid  ? ? ?Family History: ?Family History  ?Problem Relation Age of Onset  ? Heart attack Mother   ? Diabetes Sister   ? Diabetes Other   ? Hypertension Other   ? Cancer Other   ? ? ?Social History: ?Social History  ? ?Tobacco Use  ?Smoking Status Former  ? Packs/day: 1.00  ? Types: Cigarettes  ?Smokeless Tobacco Never  ? ?Social History  ? ?Substance and Sexual Activity  ?Alcohol Use No  ? ?Social History  ? ?Substance and Sexual Activity  ?Drug Use No  ? ? ?Allergies: ?Allergies  ?Allergen Reactions  ? Lyrica [Pregabalin]   ?  Made her afraid of the dark  ? ? ?Medications: ?Current Outpatient Medications  ?Medication Sig Dispense Refill  ? aspirin EC 81 MG tablet Take 81 mg by mouth daily.    ? atenolol (TENORMIN) 25 MG tablet Take 1 tablet (25 mg total) by mouth daily. 90 tablet 3  ? metFORMIN (GLUCOPHAGE) 1000 MG tablet Take 1,000  mg by mouth 2 (two) times daily.    ? mirtazapine (REMERON) 15 MG tablet Take 15 mg by mouth at bedtime.    ? Pediatric Multivitamins-Iron (FLINTSTONES PLUS IRON PO) Take 1 tablet by mouth daily.    ? pravastatin (PRAVACHOL) 40 MG tablet Take 40 mg by mouth at bedtime.     ?  QUEtiapine (SEROQUEL) 25 MG tablet Take 25 mg by mouth at bedtime.    ? vitamin B-12 (CYANOCOBALAMIN) 500 MCG tablet Take 500 mcg by mouth daily.    ? zolpidem (AMBIEN) 5 MG tablet Take 1 tablet (5 mg total) by mouth at bedtime as needed for sleep. 10 tablet 0  ? ?No current facility-administered medications for this visit.  ? ? ?Review of Systems: ?GENERAL: negative for malaise, night sweats ?HEENT: No changes in hearing or vision, no nose bleeds or other nasal problems. ?NECK: Negative for lumps, goiter, pain and significant neck swelling ?RESPIRATORY: Negative for cough, wheezing ?CARDIOVASCULAR: Negative for chest pain, leg swelling, palpitations, orthopnea ?GI: SEE HPI ?MUSCULOSKELETAL: Negative for joint pain or swelling, back pain, and muscle pain. ?SKIN: Negative for lesions, rash ?PSYCH: Negative for sleep disturbance, mood disorder and recent psychosocial stressors. ?HEMATOLOGY Negative for prolonged bleeding, bruising easily, and swollen nodes. ?ENDOCRINE: Negative for cold or heat intolerance, polyuria, polydipsia and goiter. ?NEURO: negative for tremor, gait imbalance, syncope and seizures. ?The remainder of the review of systems is noncontributory. ? ? ?Physical Exam: ?BP (!) 146/77 (BP Location: Left Arm, Patient Position: Sitting, Cuff Size: Large)   Pulse 88   Temp 97.6 ?F (36.4 ?C) (Oral)   Ht '5\' 2"'$  (1.575 m)   Wt 172 lb 8 oz (78.2 kg)   BMI 31.55 kg/m?  ?GENERAL: The patient is AO x3, in no acute distress. ?HEENT: Head is normocephalic and atraumatic. EOMI are intact. Mouth is well hydrated and without lesions. ?NECK: Supple. No masses ?LUNGS: Clear to auscultation. No presence of rhonchi/wheezing/rales. Adequate chest expansion ?HEART: RRR, normal s1 and s2. ?ABDOMEN: Soft, nontender, no guarding, no peritoneal signs, and nondistended. BS +. No masses. ?EXTREMITIES: Without any cyanosis, clubbing, rash, lesions or edema. ?NEUROLOGIC: AOx3, no focal motor deficit. ?SKIN: no jaundice, no  rashes ? ?Imaging/Labs: ?as above ? ?I personally reviewed and interpreted the available labs, imaging and endoscopic files. ? ?Impression and Plan: ?Teresa Sweeney is a 66 y.o. female with past medical history of anxiety, depression, diabetes, IDA, hyperlipidemia and hypertension,  who presents for follow up of iron deficiency anemia.  The patient had presence of very mild anemia and only minimally decreased iron saturation without presence of over gastrointestinal bleeding and with negative endoscopic investigations with an EGD and colonoscopy for source of possible gastrointestinal bleeding.  We will check her repeat CBC and iron stores while on Flintstone multivitamin determine the need to further evaluate her symptoms with a capsule endoscopy or continue conservative management with oral iron supplementation.  Patient understood and agreed. ? ?- Check CBC and iron stores. ?- Continue Flintstones multivitamins daily indefinitely ? ?All questions were answered.     ? ?Harvel Quale, MD ?Gastroenterology and Hepatology ?[facility] ? ?

## 2021-05-15 LAB — CBC WITH DIFFERENTIAL/PLATELET
Absolute Monocytes: 559 cells/uL (ref 200–950)
Basophils Absolute: 59 cells/uL (ref 0–200)
Basophils Relative: 1.4 %
Eosinophils Absolute: 298 cells/uL (ref 15–500)
Eosinophils Relative: 7.1 %
HCT: 38 % (ref 35.0–45.0)
Hemoglobin: 12 g/dL (ref 11.7–15.5)
Lymphs Abs: 1428 cells/uL (ref 850–3900)
MCH: 25 pg — ABNORMAL LOW (ref 27.0–33.0)
MCHC: 31.6 g/dL — ABNORMAL LOW (ref 32.0–36.0)
MCV: 79.2 fL — ABNORMAL LOW (ref 80.0–100.0)
MPV: 11.3 fL (ref 7.5–12.5)
Monocytes Relative: 13.3 %
Neutro Abs: 1856 cells/uL (ref 1500–7800)
Neutrophils Relative %: 44.2 %
Platelets: 225 10*3/uL (ref 140–400)
RBC: 4.8 10*6/uL (ref 3.80–5.10)
RDW: 14.1 % (ref 11.0–15.0)
Total Lymphocyte: 34 %
WBC: 4.2 10*3/uL (ref 3.8–10.8)

## 2021-05-15 LAB — IRON, TOTAL/TOTAL IRON BINDING CAP
%SAT: 23 % (calc) (ref 16–45)
Iron: 71 ug/dL (ref 45–160)
TIBC: 306 mcg/dL (calc) (ref 250–450)

## 2021-05-15 LAB — FERRITIN: Ferritin: 56 ng/mL (ref 16–288)

## 2021-06-05 ENCOUNTER — Other Ambulatory Visit (INDEPENDENT_AMBULATORY_CARE_PROVIDER_SITE_OTHER): Payer: Self-pay | Admitting: Gastroenterology

## 2021-06-05 NOTE — Telephone Encounter (Signed)
Tried calling patient to find out if she is taking this as of the last office note 05/14/20 patient told us she was not taken so this was marked off the patient's list. I tried calling no answer, vm states please enter remote access code. (Call disconnected) ?

## 2021-07-26 NOTE — Progress Notes (Signed)
CARDIOLOGY CONSULT NOTE       Patient ID: Teresa Sweeney MRN: [id number] DOB/AGE: 68-12-1955 68 y.o.  Admit date: (Not on file) Referring Physician: Earma Reading Primary Physician: [facility] Primary Cardiologist: Johnsie Cancel Reason for Consultation: Pre syncope    HPI:  66 y.o. referred by Dr Earma Reading 09/26/20  [facility] for pre syncopal spells She has a history of HTN, HLD and DM as well as anxiety Seen in [facility] 02/03/20 with dizziness ECG/Tele with some sinus tachycardia but no arrhythmia Labs showed normal d dimer, Hct, TSH and negative troponin She was mildly azotemic with BUN 29 and Cr 1.19 She was on a diuretic for HTN She was also taking seroquel, remeron , trazodone and ambien   Labs 07/27/20 A1c 6.8 Hct 34.7   We stopped her diuretic/K and norvasc in August and started her on beta blocker F/U echo and monitor were benign see below Average HR on monitor 97 bpm with nocturnal Dip normal.   Had EGD and colon for her iron deficiency anemia 01/17/21 showing erosive gastropathy and multiple polyps snared    She stopped her Toprol due to diarrhea She was put on HCTZ by her primary We started atenolol  01/31/21   No cardiac complaints     ROS All other systems reviewed and negative except as noted above  Past Medical History:  Diagnosis Date   Anxiety    Depression    Diabetes mellitus without complication ([facility])    Hyperlipemia    Hypertension     Family History  Problem Relation Age of Onset   Heart attack Mother    Diabetes Sister    Diabetes Other    Hypertension Other    Cancer Other     Social History   Socioeconomic History   Marital status: Single    Spouse name: Not on file   Number of children: Not on file   Years of education: Not on file   Highest education level: Not on file  Occupational History   Not on file  Tobacco Use   Smoking status: Former    Packs/day: 1.00    Types: Cigarettes   Smokeless  tobacco: Never  Vaping Use   Vaping Use: Never used  Substance and Sexual Activity   Alcohol use: No   Drug use: No   Sexual activity: Not on file  Other Topics Concern   Not on file  Social History Narrative   Not on file   Social Determinants of Health   Financial Resource Strain: Not on file  Food Insecurity: Not on file  Transportation Needs: Not on file  Physical Activity: Not on file  Stress: Not on file  Social Connections: Not on file  Intimate Partner Violence: Not on file    Past Surgical History:  Procedure Laterality Date   ABDOMINAL HYSTERECTOMY     BIOPSY  01/17/2021   Procedure: BIOPSY;  Surgeon: Harvel Quale, MD;  [facility];  Service: Gastroenterology;;  small, intestine, antral, body(gastric);   COLONOSCOPY WITH PROPOFOL N/A 01/17/2021   Procedure: COLONOSCOPY WITH PROPOFOL;  Surgeon: Harvel Quale, MD;  [facility];  Service: Gastroenterology;  Laterality: N/A;  7:30   ESOPHAGOGASTRODUODENOSCOPY (EGD) WITH PROPOFOL N/A 01/17/2021   Procedure: ESOPHAGOGASTRODUODENOSCOPY (EGD) WITH PROPOFOL;  Surgeon: Harvel Quale, MD;  [facility];  Service: Gastroenterology;  Laterality: N/A;   POLYPECTOMY  01/17/2021   Procedure: POLYPECTOMY;  Surgeon: Montez Morita,  Quillian Quince, MD;  [facility];  Service: Gastroenterology;;  ascendingx3; cecal ;sigmoid      Current Outpatient Medications:    albuterol (VENTOLIN HFA) 108 (90 Base) MCG/ACT inhaler, Inhale into the lungs., Disp: , Rfl:    aspirin EC 81 MG tablet, Take 81 mg by mouth daily., Disp: , Rfl:    atenolol (TENORMIN) 25 MG tablet, Take 1 tablet (25 mg total) by mouth daily., Disp: 90 tablet, Rfl: 3   metFORMIN (GLUCOPHAGE) 1000 MG tablet, Take 1,000 mg by mouth 2 (two) times daily., Disp: , Rfl:    mirtazapine (REMERON) 15 MG tablet, Take 15 mg by mouth at bedtime., Disp: , Rfl:    Pediatric Multivitamins-Iron (FLINTSTONES PLUS  IRON PO), Take 1 tablet by mouth daily., Disp: , Rfl:    pravastatin (PRAVACHOL) 40 MG tablet, Take 40 mg by mouth at bedtime. , Disp: , Rfl:    QUEtiapine (SEROQUEL) 25 MG tablet, Take 25 mg by mouth at bedtime., Disp: , Rfl:    vitamin B-12 (CYANOCOBALAMIN) 500 MCG tablet, Take 500 mcg by mouth daily., Disp: , Rfl:    zolpidem (AMBIEN) 5 MG tablet, Take 1 tablet (5 mg total) by mouth at bedtime as needed for sleep., Disp: 10 tablet, Rfl: 0    Physical Exam: Blood pressure 130/74, pulse 72, height '5\' 2"'$  (1.575 m), weight 172 lb (78 kg), SpO2 98 %.    Affect appropriate Healthy:  appears stated age 66: normal Neck supple with no adenopathy JVP normal no bruits no thyromegaly Lungs clear with no wheezing and good diaphragmatic motion Heart:  S1/S2 2/6/SEM  murmur, no rub, gallop or click PMI normal Abdomen: benighn, BS positve, no tenderness, no AAA no bruit.  No HSM or HJR Distal pulses intact with no bruits No edema Neuro non-focal Skin warm and dry No muscular weakness   Labs:   Lab Results  Component Value Date   WBC 4.2 05/14/2021   HGB 12.0 05/14/2021   HCT 38.0 05/14/2021   MCV 79.2 (L) 05/14/2021   PLT 225 05/14/2021   No results for input(s): NA, K, CL, CO2, BUN, CREATININE, CALCIUM, PROT, BILITOT, ALKPHOS, ALT, AST, GLUCOSE in the last 168 hours.  Invalid input(s): LABALBU Lab Results  Component Value Date   XTGGYIR 485 04/11/2016   TROPONINI <0.03 09/28/2015    Lab Results  Component Value Date   CHOL 181 04/12/2016   Lab Results  Component Value Date   HDL 56 04/12/2016   Lab Results  Component Value Date   LDLCALC 102 (H) 04/12/2016   Lab Results  Component Value Date   TRIG 116 04/12/2016   Lab Results  Component Value Date   CHOLHDL 3.2 04/12/2016   No results found for: LDLDIRECT    Radiology: No results found.  EKG: 02/04/20 ST rate 107 nonspecific ST changes 08/01/2021 ST rate 108 nonspecific ST changes 08/01/2021 ST rate 108  nonspecific ST changes    ASSESSMENT AND PLAN:   Pre syncope:  in setting of diuretic use and azotemia on labs as well as multiple psycho active meds ECG not high risk  Normal echo EF 60-65% Not clear why her primary put her back on diuretic given above issues Monitor 09/26/20 no significant arrhythmia and normal nocturnal drop in HR  HTN:  Avoid diuretic with azotemia continue beta blocker  HLD:  continue statin  DM:  Discussed low carb diet.  Target hemoglobin A1c is 6.5 or less.  Continue current medications. 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

## 2021-08-01 ENCOUNTER — Ambulatory Visit: Payer: Medicare (Managed Care) | Admitting: Cardiovascular Disease

## 2021-08-01 ENCOUNTER — Encounter: Payer: Self-pay | Admitting: Cardiovascular Disease

## 2021-08-01 VITALS — BP 130/74 | HR 72 | Ht 62.0 in | Wt 172.0 lb

## 2021-08-01 DIAGNOSIS — R000 Tachycardia, unspecified: Secondary | ICD-10-CM | POA: Diagnosis not present

## 2021-08-01 DIAGNOSIS — R55 Syncope and collapse: Secondary | ICD-10-CM | POA: Diagnosis not present

## 2021-08-01 DIAGNOSIS — I10 Essential (primary) hypertension: Secondary | ICD-10-CM | POA: Diagnosis not present

## 2021-08-01 DIAGNOSIS — R011 Cardiac murmur, unspecified: Secondary | ICD-10-CM

## 2021-08-01 DIAGNOSIS — E782 Mixed hyperlipidemia: Secondary | ICD-10-CM

## 2021-08-01 NOTE — Patient Instructions (Signed)
Medication Instructions:  Your physician recommends that you continue on your current medications as directed. Please refer to the Current Medication list given to you today.  *If you need a refill on your cardiac medications before your next appointment, please call your pharmacy*   Lab Work: NONE   If you have labs (blood work) drawn today and your tests are completely normal, you will receive your results only by: MyChart Message (if you have MyChart) OR A paper copy in the mail If you have any lab test that is abnormal or we need to change your treatment, we will call you to review the results.   Testing/Procedures: NONE    Follow-Up: At [facility], you and your health needs are our priority.  As part of our continuing mission to provide you with exceptional heart care, we have created designated Provider Care Teams.  These Care Teams include your primary Cardiologist (physician) and Advanced Practice Providers (APPs -  Physician Assistants and Nurse Practitioners) who all work together to provide you with the care you need, when you need it.  We recommend signing up for the patient portal called "MyChart".  Sign up information is provided on this After Visit Summary.  MyChart is used to connect with patients for Virtual Visits (Telemedicine).  Patients are able to view lab/test results, encounter notes, upcoming appointments, etc.  Non-urgent messages can be sent to your provider as well.   To learn more about what you can do with MyChart, go to [email or web address].    Your next appointment:    As Needed   The format for your next appointment:   In Person  Provider:   Peter Nishan, MD    Other Instructions Thank you for choosing [facility]!    Important Information About Sugar       

## 2021-08-17 ENCOUNTER — Emergency Department (HOSPITAL_COMMUNITY): Payer: Medicare (Managed Care)

## 2021-08-17 ENCOUNTER — Encounter (HOSPITAL_COMMUNITY): Payer: Self-pay

## 2021-08-17 ENCOUNTER — Other Ambulatory Visit: Payer: Self-pay

## 2021-08-17 ENCOUNTER — Emergency Department (HOSPITAL_COMMUNITY)
Admission: EM | Admit: 2021-08-17 | Discharge: 2021-08-17 | Disposition: A | Discharge status: Home / Self Care | Payer: Medicare (Managed Care) | Attending: Emergency Medicine | Admitting: Emergency Medicine

## 2021-08-17 DIAGNOSIS — I10 Essential (primary) hypertension: Secondary | ICD-10-CM | POA: Diagnosis not present

## 2021-08-17 DIAGNOSIS — Z79899 Other long term (current) drug therapy: Secondary | ICD-10-CM | POA: Insufficient documentation

## 2021-08-17 DIAGNOSIS — E119 Type 2 diabetes mellitus without complications: Secondary | ICD-10-CM | POA: Diagnosis not present

## 2021-08-17 DIAGNOSIS — M542 Cervicalgia: Secondary | ICD-10-CM | POA: Insufficient documentation

## 2021-08-17 DIAGNOSIS — Z7984 Long term (current) use of oral hypoglycemic drugs: Secondary | ICD-10-CM | POA: Diagnosis not present

## 2021-08-17 DIAGNOSIS — R519 Headache, unspecified: Secondary | ICD-10-CM

## 2021-08-17 MED ORDER — DEXAMETHASONE SODIUM PHOSPHATE 4 MG/ML IJ SOLN
4.0000 mg | Freq: Once | INTRAMUSCULAR | Status: AC
  Administered 2021-08-17: 4 mg via INTRAVENOUS
  Filled 2021-08-17: qty 1

## 2021-08-17 MED ORDER — METOCLOPRAMIDE HCL 5 MG/ML IJ SOLN
10.0000 mg | Freq: Once | INTRAMUSCULAR | Status: AC
  Administered 2021-08-17: 10 mg via INTRAVENOUS
  Filled 2021-08-17: qty 2

## 2021-08-17 MED ORDER — KETOROLAC TROMETHAMINE 15 MG/ML IJ SOLN
15.0000 mg | Freq: Once | INTRAMUSCULAR | Status: AC
  Administered 2021-08-17: 15 mg via INTRAVENOUS
  Filled 2021-08-17: qty 1

## 2021-08-17 NOTE — ED Provider Notes (Signed)
[facility] EMERGENCY DEPARTMENT Provider Note   CSN: [id number] Arrival date & time: 08/17/21  1402     History  Chief Complaint  Patient presents with   Headache    Teresa Sweeney is a 66 y.o. female.   Headache   Patient with medical history of hypertension, per lipidemia, diabetes, anxiety presents today due to headache.  The headache has been going on for about a week, comes and goes.  It starts in the morning and is waxing and waning throughout the day.  She is unable identify any provoking or alleviating factors.  She is tried Excedrin, Tylenol and ibuprofen with minimal improvement.  She denies any fevers, vision change, exertional component, lateralized weakness, chest pain, shortness of breath, neck pain, fevers.  Home Medications Prior to Admission medications   Medication Sig Start Date End Date Taking? Authorizing Provider  albuterol (VENTOLIN HFA) 108 (90 Base) MCG/ACT inhaler Inhale into the lungs. 05/16/21   [provider]  aspirin EC 81 MG tablet Take 81 mg by mouth daily.    [provider]  atenolol (TENORMIN) 25 MG tablet Take 1 tablet (25 mg total) by mouth daily. 01/31/21 08/01/21  Wendall Stade, MD  metFORMIN (GLUCOPHAGE) 1000 MG tablet Take 1,000 mg by mouth 2 (two) times daily. 06/30/20   [provider]  mirtazapine (REMERON) 15 MG tablet Take 15 mg by mouth at bedtime. 07/06/19   [provider]  Pediatric Multivitamins-Iron (FLINTSTONES PLUS IRON PO) Take 1 tablet by mouth daily.    [provider]  pravastatin (PRAVACHOL) 40 MG tablet Take 40 mg by mouth at bedtime.  09/22/15   [provider]  QUEtiapine (SEROQUEL) 25 MG tablet Take 25 mg by mouth at bedtime. 01/11/20   [provider]  vitamin B-12 (CYANOCOBALAMIN) 500 MCG tablet Take 500 mcg by mouth daily.    [provider]  zolpidem (AMBIEN) 5 MG tablet Take 1 tablet (5 mg total) by mouth at bedtime as needed for sleep.  06/14/17   Benjiman Core, MD      Allergies    Lyrica [pregabalin]    Review of Systems   Review of Systems  Neurological:  Positive for headaches.    Physical Exam Updated Vital Signs BP (!) 147/76 (BP Location: Right Arm)   Pulse 74   Temp 98 F (36.7 C) (Oral)   Resp 18   Ht 5\' 1"  (1.549 m)   Wt 77.6 kg   SpO2 94%   BMI 32.31 kg/m  Physical Exam Vitals and nursing note reviewed. Exam conducted with a chaperone present.  Constitutional:      Appearance: Normal appearance.  HENT:     Head: Normocephalic and atraumatic.  Eyes:     General: No scleral icterus.       Right eye: No discharge.        Left eye: No discharge.     Extraocular Movements: Extraocular movements intact.     Pupils: Pupils are equal, round, and reactive to light.  Cardiovascular:     Rate and Rhythm: Normal rate and regular rhythm.     Pulses: Normal pulses.     Heart sounds: Normal heart sounds. No murmur heard.    No friction rub. No gallop.     Comments: No carotid bruits Pulmonary:     Effort: Pulmonary effort is normal. No respiratory distress.     Breath sounds: Normal breath sounds.  Abdominal:     General: Abdomen is flat.  Bowel sounds are normal. There is no distension.     Palpations: Abdomen is soft.     Tenderness: There is no abdominal tenderness.  Musculoskeletal:     Cervical back: Normal range of motion.  Lymphadenopathy:     Cervical: No cervical adenopathy.  Skin:    General: Skin is warm and dry.     Capillary Refill: Capillary refill takes less than 2 seconds.     Coloration: Skin is not jaundiced.  Neurological:     Mental Status: She is alert. Mental status is at baseline.     GCS: GCS eye subscore is 4. GCS verbal subscore is 5. GCS motor subscore is 6.     Cranial Nerves: No cranial nerve deficit.     Coordination: Coordination normal.     Comments: Cranial nerves III through XII are gross intact.  Grips and equal bilaterally, no pronator drift.  Lower  extremity strength equal bilaterally.     ED Results / Procedures / Treatments   Labs (all labs ordered are listed, but only abnormal results are displayed) Labs Reviewed - No data to display  EKG None  Radiology CT Head Wo Contrast  Result Date: 08/17/2021 CLINICAL DATA:  Headache.  Worsening headache EXAM: CT HEAD WITHOUT CONTRAST TECHNIQUE: Contiguous axial images were obtained from the base of the skull through the vertex without intravenous contrast. RADIATION DOSE REDUCTION: This exam was performed according to the departmental dose-optimization program which includes automated exposure control, adjustment of the mA and/or kV according to patient size and/or use of iterative reconstruction technique. COMPARISON:  None Available. FINDINGS: Brain: No acute intracranial hemorrhage. No focal mass lesion. No CT evidence of acute infarction. No midline shift or mass effect. No hydrocephalus. Basilar cisterns are patent. There are periventricular and subcortical white matter hypodensities. Generalized cortical atrophy. Vascular: No hyperdense vessel or unexpected calcification. Skull: Normal. Negative for fracture or focal lesion. Sinuses/Orbits: Paranasal sinuses and mastoid air cells are clear. Orbits are clear. Other: None. IMPRESSION: 1. No acute intracranial findings. 2. Chronic atrophy and white matter microvascular disease. Electronically Signed   By: Genevive Bi M.D.   On: 08/17/2021 15:24    Procedures Procedures    Medications Ordered in ED Medications  ketorolac (TORADOL) 15 MG/ML injection 15 mg (15 mg Intravenous Given 08/17/21 1540)  metoCLOPramide (REGLAN) injection 10 mg (10 mg Intravenous Given 08/17/21 1539)  dexamethasone (DECADRON) injection 4 mg (4 mg Intravenous Given 08/17/21 1540)    ED Course/ Medical Decision Making/ A&P                           Medical Decision Making Amount and/or Complexity of Data Reviewed Radiology: ordered.  Risk Prescription drug  management.   Patient presents due to headache.  Is been going on for a week, it waxes and wanes.  Does not appear to be any vascular component, I do not auscultate any carotid bruits.  Additionally still having any neck pain or fevers I do not think this is meningitis.  Not significantly hypertensive, CT head ordered and reviewed and does not show any evidence of SAH, cranial mass.    Considered CTA head and neck but ultimately do not feel indicated at this time.  Patient does not have any focal deficits, this is not consistent with a stroke.  I ordered Toradol, fluids, Decadron.  On reevaluation patient's symptoms have improved and almost fully resolved.  I considered additional work-up but given resolution,  no red flag symptoms, and negative head CT I do not think indicated.  At this time I think patient is stable and appropriate for outpatient follow-up with her PCP.  Patient was discharged in stable condition        Final Clinical Impression(s) / ED Diagnoses Final diagnoses:  Bad headache    Rx / DC Orders ED Discharge Orders     None         Theron Arista, PA-C 08/17/21 1637    Derwood Kaplan, MD 08/18/21 [phone]

## 2021-08-17 NOTE — ED Triage Notes (Signed)
Pt presents to ED with complaints of headache x 1 week.

## 2021-08-20 ENCOUNTER — Other Ambulatory Visit: Payer: Self-pay | Admitting: Family Medicine

## 2021-08-20 ENCOUNTER — Other Ambulatory Visit (HOSPITAL_COMMUNITY): Payer: Self-pay | Admitting: Family Medicine

## 2021-08-20 DIAGNOSIS — N1832 Chronic kidney disease, stage 3b: Secondary | ICD-10-CM

## 2021-09-03 ENCOUNTER — Ambulatory Visit (HOSPITAL_COMMUNITY)
Admission: RE | Admit: 2021-09-03 | Discharge: 2021-09-03 | Disposition: A | Discharge status: Home / Self Care | Payer: Medicare PPO | Source: Ambulatory Visit | Attending: Family Medicine | Admitting: Family Medicine

## 2021-09-03 DIAGNOSIS — N1832 Chronic kidney disease, stage 3b: Secondary | ICD-10-CM | POA: Diagnosis not present

## 2022-01-18 ENCOUNTER — Other Ambulatory Visit: Payer: Self-pay | Admitting: Cardiovascular Disease

## 2022-09-04 DIAGNOSIS — E119 Type 2 diabetes mellitus without complications: Secondary | ICD-10-CM | POA: Diagnosis not present

## 2022-09-04 DIAGNOSIS — E669 Obesity, unspecified: Secondary | ICD-10-CM | POA: Diagnosis not present

## 2022-09-04 DIAGNOSIS — E782 Mixed hyperlipidemia: Secondary | ICD-10-CM | POA: Diagnosis not present

## 2022-09-04 DIAGNOSIS — I10 Essential (primary) hypertension: Secondary | ICD-10-CM | POA: Diagnosis not present

## 2022-09-18 DIAGNOSIS — Z79899 Other long term (current) drug therapy: Secondary | ICD-10-CM | POA: Diagnosis not present

## 2022-09-18 DIAGNOSIS — F329 Major depressive disorder, single episode, unspecified: Secondary | ICD-10-CM | POA: Diagnosis not present

## 2022-10-17 IMAGING — DX DG CHEST 1V PORT
1 series · 1 of 1 positions shown · non-contrast
Comparison: December 08, 2015

CLINICAL DATA: Tachycardia.

EXAM:
PORTABLE CHEST 1 VIEW

[chest ap]
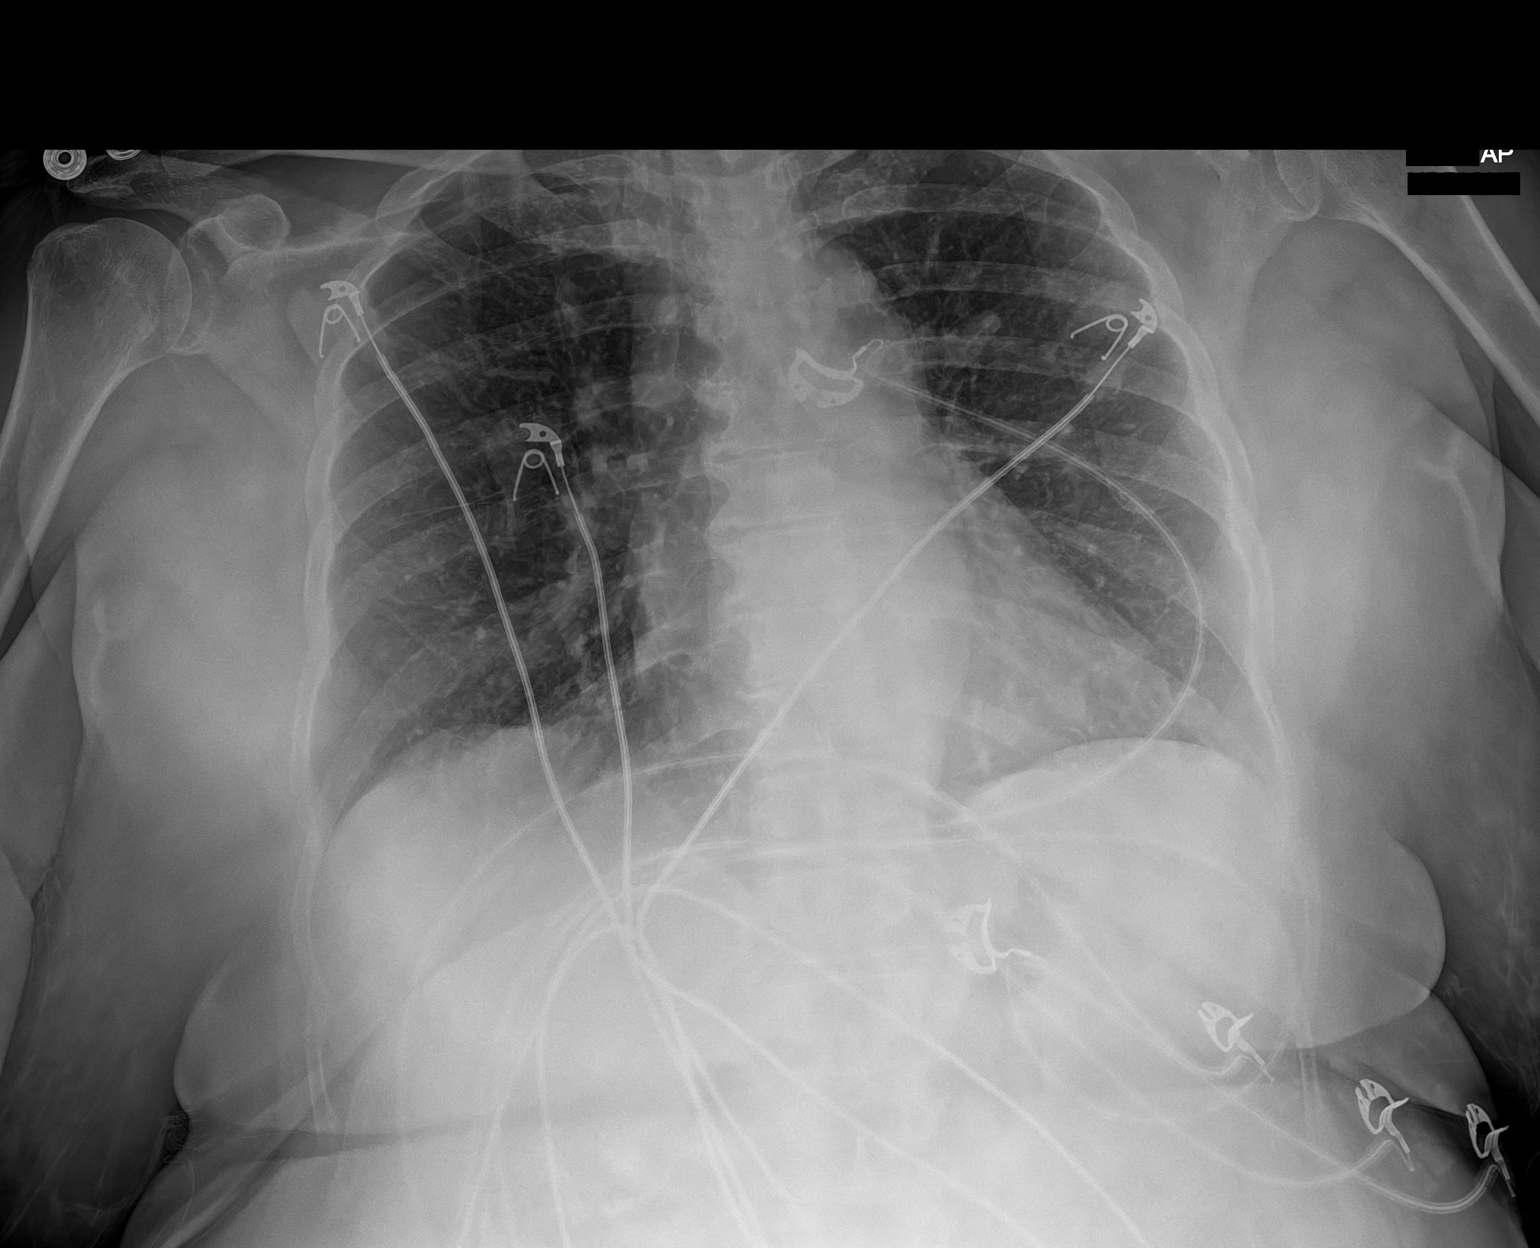

[1 of 1 positions shown; findings below may reference images not displayed]

FINDINGS: Very mild, diffuse chronic appearing increased lung markings are
seen. There is no evidence of acute infiltrate, pleural effusion or
pneumothorax. The heart size and mediastinal contours are within
normal limits. Degenerative changes seen throughout the thoracic
spine.
IMPRESSION: No acute or active cardiopulmonary disease.

## 2022-12-05 DIAGNOSIS — E782 Mixed hyperlipidemia: Secondary | ICD-10-CM | POA: Diagnosis not present

## 2022-12-05 DIAGNOSIS — I10 Essential (primary) hypertension: Secondary | ICD-10-CM | POA: Diagnosis not present

## 2022-12-05 DIAGNOSIS — E119 Type 2 diabetes mellitus without complications: Secondary | ICD-10-CM | POA: Diagnosis not present

## 2022-12-05 DIAGNOSIS — E1122 Type 2 diabetes mellitus with diabetic chronic kidney disease: Secondary | ICD-10-CM | POA: Diagnosis not present

## 2022-12-05 DIAGNOSIS — Z87891 Personal history of nicotine dependence: Secondary | ICD-10-CM | POA: Diagnosis not present

## 2022-12-05 DIAGNOSIS — K760 Fatty (change of) liver, not elsewhere classified: Secondary | ICD-10-CM | POA: Diagnosis not present

## 2022-12-05 DIAGNOSIS — Z90710 Acquired absence of both cervix and uterus: Secondary | ICD-10-CM | POA: Diagnosis not present

## 2022-12-05 DIAGNOSIS — N1832 Chronic kidney disease, stage 3b: Secondary | ICD-10-CM | POA: Diagnosis not present

## 2022-12-05 DIAGNOSIS — Z23 Encounter for immunization: Secondary | ICD-10-CM | POA: Diagnosis not present

## 2023-01-01 DIAGNOSIS — Z79899 Other long term (current) drug therapy: Secondary | ICD-10-CM | POA: Diagnosis not present

## 2023-01-01 DIAGNOSIS — F329 Major depressive disorder, single episode, unspecified: Secondary | ICD-10-CM | POA: Diagnosis not present

## 2023-02-17 ENCOUNTER — Emergency Department (HOSPITAL_COMMUNITY)
Admission: EM | Admit: 2023-02-17 | Discharge: 2023-02-17 | Disposition: A | Discharge status: Home / Self Care | Payer: Medicare HMO | Attending: Emergency Medicine | Admitting: Emergency Medicine

## 2023-02-17 ENCOUNTER — Other Ambulatory Visit: Payer: Self-pay

## 2023-02-17 ENCOUNTER — Emergency Department (HOSPITAL_COMMUNITY): Payer: Medicare HMO

## 2023-02-17 ENCOUNTER — Encounter (HOSPITAL_COMMUNITY): Payer: Self-pay | Admitting: *Deleted

## 2023-02-17 DIAGNOSIS — Z7982 Long term (current) use of aspirin: Secondary | ICD-10-CM | POA: Insufficient documentation

## 2023-02-17 DIAGNOSIS — E119 Type 2 diabetes mellitus without complications: Secondary | ICD-10-CM | POA: Diagnosis not present

## 2023-02-17 DIAGNOSIS — Z7984 Long term (current) use of oral hypoglycemic drugs: Secondary | ICD-10-CM | POA: Insufficient documentation

## 2023-02-17 DIAGNOSIS — R197 Diarrhea, unspecified: Secondary | ICD-10-CM | POA: Diagnosis present

## 2023-02-17 DIAGNOSIS — R140 Abdominal distension (gaseous): Secondary | ICD-10-CM | POA: Diagnosis not present

## 2023-02-17 LAB — URINALYSIS, W/ REFLEX TO CULTURE (INFECTION SUSPECTED)
Bacteria, UA: NONE SEEN
Bilirubin Urine: NEGATIVE
Glucose, UA: 50 mg/dL — AB
Hgb urine dipstick: NEGATIVE
Ketones, ur: NEGATIVE mg/dL
Leukocytes,Ua: NEGATIVE
Nitrite: NEGATIVE
Protein, ur: NEGATIVE mg/dL
Specific Gravity, Urine: 1.014 (ref 1.005–1.030)
pH: 6 (ref 5.0–8.0)

## 2023-02-17 LAB — COMPREHENSIVE METABOLIC PANEL
ALT: 37 U/L (ref 0–44)
AST: 29 U/L (ref 15–41)
Albumin: 4.3 g/dL (ref 3.5–5.0)
Alkaline Phosphatase: 88 U/L (ref 38–126)
Anion gap: 13 (ref 5–15)
BUN: 22 mg/dL (ref 8–23)
CO2: 24 mmol/L (ref 22–32)
Calcium: 10.1 mg/dL (ref 8.9–10.3)
Chloride: 105 mmol/L (ref 98–111)
Creatinine, Ser: 1.01 mg/dL — ABNORMAL HIGH (ref 0.44–1.00)
GFR, Estimated: >60 mL/min (ref >60)
Glucose, Bld: 96 mg/dL (ref 70–99)
Potassium: 4.5 mmol/L (ref 3.5–5.1)
Sodium: 142 mmol/L (ref 135–145)
Total Bilirubin: 0.2 mg/dL (ref <1.2)
Total Protein: 8.9 g/dL — ABNORMAL HIGH (ref 6.5–8.1)

## 2023-02-17 LAB — LIPASE, BLOOD: Lipase: 56 U/L — ABNORMAL HIGH (ref 11–51)

## 2023-02-17 LAB — CBC
HCT: 42.4 % (ref 36.0–46.0)
Hemoglobin: 12.7 g/dL (ref 12.0–15.0)
MCH: 25 pg — ABNORMAL LOW (ref 26.0–34.0)
MCHC: 30 g/dL (ref 30.0–36.0)
MCV: 83.3 fL (ref 80.0–100.0)
Platelets: 253 10*3/uL (ref 150–400)
RBC: 5.09 MIL/uL (ref 3.87–5.11)
RDW: 14.5 % (ref 11.5–15.5)
WBC: 5.5 10*3/uL (ref 4.0–10.5)
nRBC: 0 % (ref 0.0–0.2)

## 2023-02-17 LAB — CBG MONITORING, ED
Glucose-Capillary: 92 mg/dL (ref 70–99)
Glucose-Capillary: 77 mg/dL (ref 70–99)
Glucose-Capillary: 82 mg/dL (ref 70–99)
Glucose-Capillary: 77 mg/dL (ref 70–99)

## 2023-02-17 MED ORDER — LOPERAMIDE HCL 2 MG PO CAPS: 2.0000 mg | ORAL_CAPSULE | Freq: Four times a day (QID) | ORAL | 0 refills | Status: AC | PRN

## 2023-02-17 MED ORDER — IOHEXOL 300 MG/ML  SOLN
100.0000 mL | Freq: Once | INTRAMUSCULAR | Status: AC | PRN
  Administered 2023-02-17: 100 mL via INTRAVENOUS

## 2023-02-17 NOTE — ED Triage Notes (Signed)
Pt c/o mid abdominal pain and diarrhea x 2-3 weeks. Denies n/v and fever.

## 2023-02-17 NOTE — Discharge Instructions (Addendum)
It was a pleasure taking care of you here in the emergency department today  It is unclear what is causing your diarrhea you may take Imodium, 1 tablet for each episode of loose stool until it resolves  You have a stones in your gallbladder.  If you develop pain to your right upper abdomen, persistent nausea, vomiting, fever please seek reevaluation otherwise follow-up with your gastroenterologist  Return for new or worsening symptoms

## 2023-02-17 NOTE — ED Provider Notes (Signed)
[facility] EMERGENCY DEPARTMENT AT [facility] Provider Note   CSN: [id number] Arrival date & time: 02/17/23  1604     History  Chief Complaint  Patient presents with   Abdominal Pain    Teresa Sweeney is a 67 y.o. female history of pretension, diabetes, hyperlipidemia here for evaluation of abdominal pain.  Pain generalized throughout her mid abdomen.  Associated diarrhea.  Symptoms over the last 2 to 3 weeks.  Having 2-3 episodes of loose stool without any blood.  No recent antibiotics or travel.  No sick contacts.  No fever, nausea, vomiting, chest pain, shortness of breath.  Has some occasional dysuria without hematuria. No meds PTA. Feels like her ABD is more distended than normal. No hx of CHF, cirrhosis, ascites, CA.  HPI     Home Medications Prior to Admission medications   Medication Sig Start Date End Date Taking? Authorizing Provider  loperamide (IMODIUM) 2 MG capsule Take 1 capsule (2 mg total) by mouth 4 (four) times daily as needed for diarrhea or loose stools. 02/17/23  Yes Melena Hayes A, PA-C  albuterol (VENTOLIN HFA) 108 (90 Base) MCG/ACT inhaler Inhale into the lungs. 05/16/21   [provider]  aspirin EC 81 MG tablet Take 81 mg by mouth daily.    [provider]  atenolol (TENORMIN) 25 MG tablet TAKE ONE TABLET BY MOUTH EVERY DAY 01/21/22   Wendall Stade, MD  metFORMIN (GLUCOPHAGE) 1000 MG tablet Take 1,000 mg by mouth 2 (two) times daily. 06/30/20   [provider]  mirtazapine (REMERON) 15 MG tablet Take 15 mg by mouth at bedtime. 07/06/19   [provider]  Pediatric Multivitamins-Iron (FLINTSTONES PLUS IRON PO) Take 1 tablet by mouth daily.    [provider]  pravastatin (PRAVACHOL) 40 MG tablet Take 40 mg by mouth at bedtime.  09/22/15   [provider]  QUEtiapine (SEROQUEL) 25 MG tablet Take 25 mg by mouth at bedtime. 01/11/20   [provider]  vitamin B-12 (CYANOCOBALAMIN)  500 MCG tablet Take 500 mcg by mouth daily.    [provider]  zolpidem (AMBIEN) 5 MG tablet Take 1 tablet (5 mg total) by mouth at bedtime as needed for sleep. 06/14/17   Benjiman Core, MD      Allergies    Lyrica [pregabalin]    Review of Systems   Review of Systems  Constitutional: Negative.   HENT: Negative.    Respiratory: Negative.    Cardiovascular: Negative.   Gastrointestinal:  Positive for abdominal distention, abdominal pain and diarrhea. Negative for anal bleeding, blood in stool, constipation, nausea, rectal pain and vomiting.  Genitourinary:  Positive for dysuria. Negative for difficulty urinating and dyspareunia.  Musculoskeletal: Negative.   Skin: Negative.   Neurological: Negative.   All other systems reviewed and are negative.   Physical Exam Updated Vital Signs BP (!) 155/83   Pulse 85   Temp 97.7 F (36.5 C) (Oral)   Resp 18   Ht 5\' 1"  (1.549 m)   Wt 77.1 kg   SpO2 100%   BMI 32.12 kg/m  Physical Exam Vitals and nursing note reviewed.  Constitutional:      General: She is not in acute distress.    Appearance: She is well-developed. She is not ill-appearing, toxic-appearing or diaphoretic.  HENT:     Head: Atraumatic.  Eyes:     Pupils: Pupils are equal, round, and reactive to light.  Cardiovascular:     Rate and  Rhythm: Normal rate.  Pulmonary:     Effort: No respiratory distress.  Abdominal:     General: Bowel sounds are normal. There is distension.     Tenderness: There is no abdominal tenderness. There is no right CVA tenderness, left CVA tenderness, guarding or rebound. Negative signs include Murphy's sign and McBurney's sign.     Hernia: No hernia is present.  Musculoskeletal:        General: Normal range of motion.     Cervical back: Normal range of motion.  Skin:    General: Skin is warm and dry.  Neurological:     General: No focal deficit present.     Mental Status: She is alert.  Psychiatric:        Mood and Affect:  Mood normal.     ED Results / Procedures / Treatments   Labs (all labs ordered are listed, but only abnormal results are displayed) Labs Reviewed  LIPASE, BLOOD - Abnormal; Notable for the following components:      Result Value   Lipase 56 (*)    All other components within normal limits  COMPREHENSIVE METABOLIC PANEL - Abnormal; Notable for the following components:   Creatinine, Ser 1.01 (*)    Total Protein 8.9 (*)    All other components within normal limits  CBC - Abnormal; Notable for the following components:   MCH 25.0 (*)    All other components within normal limits  URINALYSIS, W/ REFLEX TO CULTURE (INFECTION SUSPECTED) - Abnormal; Notable for the following components:   Color, Urine STRAW (*)    Glucose, UA 50 (*)    All other components within normal limits  GASTROINTESTINAL PANEL BY PCR, STOOL (REPLACES STOOL CULTURE)  C DIFFICILE QUICK SCREEN W PCR REFLEX    CBG MONITORING, ED  CBG MONITORING, ED  CBG MONITORING, ED  CBG MONITORING, ED    EKG None  Radiology CT ABDOMEN PELVIS W CONTRAST Result Date: 02/17/2023 CLINICAL DATA:  Mid abdominal pain and diarrhea for 2-3 weeks. EXAM: CT ABDOMEN AND PELVIS WITH CONTRAST TECHNIQUE: Multidetector CT imaging of the abdomen and pelvis was performed using the standard protocol following bolus administration of intravenous contrast. RADIATION DOSE REDUCTION: This exam was performed according to the departmental dose-optimization program which includes automated exposure control, adjustment of the mA and/or kV according to patient size and/or use of iterative reconstruction technique. CONTRAST:  OMNIPAQUE IOHEXOL 300 MG/ML  SOLN COMPARISON:  Renal ultrasound 09/03/2021 and abdominal ultrasound 08/31/2019 FINDINGS: Lower chest: No acute abnormality. Hepatobiliary: Hepatic steatosis. Cholelithiasis including a stone in the cystic duct. No evidence of acute cholecystitis. No biliary dilation. Pancreas: Unremarkable. Spleen:  Unremarkable. Adrenals/Urinary Tract: 1.8 cm enhancing nodule in the left adrenal gland (Hounsfield units 101). Unremarkable left adrenal gland no urinary calculi or hydronephrosis. Bladder is unremarkable. Stomach/Bowel: Normal caliber large and small bowel. Colonic diverticulosis without diverticulitis. no bowel wall thickening. The appendix is normal.Stomach is within normal limits. Vascular/Lymphatic: Aortic atherosclerosis. No enlarged abdominal or pelvic lymph nodes. Reproductive: Status post hysterectomy. No adnexal masses. Other: No free intraperitoneal fluid or air. Musculoskeletal: No acute fracture. IMPRESSION: 1. No acute abnormality in the abdomen or pelvis. 2. Cholelithiasis including a stone in the cystic duct. No evidence of acute cholecystitis. 3. Hepatic steatosis. 4.  Colonic diverticulosis without diverticulitis. 5. 1.8 cm enhancing nodule in the left adrenal gland. This is indeterminate but favored to represent a benign adenoma. Follow-up adrenal washout CT in 1 year is recommended to ensure stability. Aortic  Atherosclerosis (ICD10-I70.0). Electronically Signed   By: Minerva Fester M.D.   On: 02/17/2023 21:24    Procedures Procedures    Medications Ordered in ED Medications  iohexol (OMNIPAQUE) 300 MG/ML solution 100 mL (100 mLs Intravenous Contrast Given 02/17/23 1946)    ED Course/ Medical Decision Making/ A&P   67 year old here for evaluation of abdominal pain and diarrhea over the last 2 to 3 weeks.  She is afebrile, nonseptic, not ill-appearing.  Abdomen appears distended, nontender palpation.  No obvious hernias.  She denies any recent antibiotics or travel.  No suspicious food intake.  No history of C. difficile.  She denies any chronic EtOH use, history of cirrhosis, ascites, cancer.  She also admits to some dysuria.  Will plan on labs, imaging and reassess  Labs and imaging personally viewed and interpreted:  CBC without leukocytosis 9 lipase 56 Metabolic panel  creatinine 1.01 UA negative for infection CT [facility] shows no acute abnormality, she does have stones in her gallbladder, however no acute cholecystitis. Cystic duct-- discussed with attending. Rec RU with GI outpatient.  Normal LFTs  Patient is not been able to give a stool sample  I discussed results with patient.  She does not need anything for pain here in the emergency department she does not have any current pain.  Will have her follow-up with gastroenterology.  Discussed Imodium as needed for diarrhea  Patient is nontoxic, nonseptic appearing, in no apparent distress.  Patient's pain and other symptoms adequately managed in emergency department.  Fluid bolus given.  Labs, imaging and vitals reviewed.  Patient does not meet the SIRS or Sepsis criteria.  On repeat exam patient does not have a surgical abdomin and there are no peritoneal signs.  No indication of appendicitis, bowel obstruction, bowel perforation, cholecystitis, choledocholithiasis, cholangitis, diverticulitis, AAA, dissection.  Patient discharged home with symptomatic treatment and given strict instructions for follow-up with their primary care physician.  I have also discussed reasons to return immediately to the ER.  Patient expresses understanding and agrees with plan.                                 Medical Decision Making Amount and/or Complexity of Data Reviewed Independent Historian: friend External Data Reviewed: labs, radiology and notes. Labs: ordered. Decision-making details documented in ED Course. Radiology: ordered and independent interpretation performed. Decision-making details documented in ED Course.  Risk OTC drugs. Prescription drug management. Decision regarding hospitalization. Diagnosis or treatment significantly limited by social determinants of health.          Final Clinical Impression(s) / ED Diagnoses Final diagnoses:  Diarrhea, unspecified type    Rx / DC Orders ED Discharge Orders           Ordered    loperamide (IMODIUM) 2 MG capsule  4 times daily PRN        02/17/23 2149              Larinda Herter A, PA-C 02/17/23 2150    Loetta Rough, MD 02/17/23 2158

## 2023-03-20 ENCOUNTER — Encounter (INDEPENDENT_AMBULATORY_CARE_PROVIDER_SITE_OTHER): Payer: Self-pay | Admitting: *Deleted

## 2023-03-28 ENCOUNTER — Other Ambulatory Visit: Payer: Self-pay | Admitting: *Deleted

## 2023-03-28 DIAGNOSIS — K8020 Calculus of gallbladder without cholecystitis without obstruction: Secondary | ICD-10-CM

## 2023-04-01 ENCOUNTER — Encounter: Payer: Self-pay | Admitting: General Surgery

## 2023-04-01 ENCOUNTER — Ambulatory Visit: Payer: Medicare HMO | Admitting: General Surgery

## 2023-04-01 VITALS — BP 112/73 | HR 110 | Temp 98.4°F | Resp 12 | Ht 61.0 in | Wt 174.0 lb

## 2023-04-01 DIAGNOSIS — K8020 Calculus of gallbladder without cholecystitis without obstruction: Secondary | ICD-10-CM | POA: Diagnosis not present

## 2023-04-01 NOTE — Progress Notes (Signed)
 Teresa Sweeney; [id number]; 10-Jun-1955   HPI Patient is a 68 year old black female who was referred to my care by Mariano Lindau for evaluation and treatment of cholelithiasis.  This was seen on CT scan of the abdomen pelvis when she went to the emergency room in December 2024 for abdominal bloating and diarrhea.  She denied any right upper quadrant abdominal pain.  She states that since that time, she continues to have some bloating and discomfort in the lower abdomen.  She states her bowel movements are normal.  She denies any active diarrhea, fever, chills, or jaundice.  She denies any significant fatty food intolerance.  Again, she denies any right upper quadrant abdominal pain that radiates around her right flank to her back.  She has seen Dr. Eartha of GI in the past for colon polyps and epigastric discomfort. Past Medical History:  Diagnosis Date   Anxiety    Depression    Diabetes mellitus without complication (HCC)    Hyperlipemia    Hypertension     Past Surgical History:  Procedure Laterality Date   ABDOMINAL HYSTERECTOMY     BIOPSY  01/17/2021   Procedure: BIOPSY;  Surgeon: Eartha Angelia Sieving, MD;  Location: [facility];  Service: Gastroenterology;;  small, intestine, antral, body(gastric);   COLONOSCOPY WITH PROPOFOL  N/A 01/17/2021   Procedure: COLONOSCOPY WITH PROPOFOL ;  Surgeon: Eartha Angelia Sieving, MD;  Location: [facility];  Service: Gastroenterology;  Laterality: N/A;  7:30   ESOPHAGOGASTRODUODENOSCOPY (EGD) WITH PROPOFOL  N/A 01/17/2021   Procedure: ESOPHAGOGASTRODUODENOSCOPY (EGD) WITH PROPOFOL ;  Surgeon: Eartha Angelia Sieving, MD;  Location: [facility];  Service: Gastroenterology;  Laterality: N/A;   POLYPECTOMY  01/17/2021   Procedure: POLYPECTOMY;  Surgeon: Eartha Angelia Sieving, MD;  Location: [facility];  Service: Gastroenterology;;  ascendingx3; cecal ;sigmoid    Family History  Problem Relation Age of Onset   Heart attack  Mother    Diabetes Sister    Diabetes Other    Hypertension Other    Cancer Other     Current Outpatient Medications on File Prior to Visit  Medication Sig Dispense Refill   albuterol (VENTOLIN HFA) 108 (90 Base) MCG/ACT inhaler Inhale into the lungs.     amLODipine (NORVASC) 5 MG tablet Take 5 mg by mouth daily.     aspirin EC 81 MG tablet Take 81 mg by mouth daily.     atenolol  (TENORMIN ) 25 MG tablet TAKE ONE TABLET BY MOUTH EVERY DAY 90 tablet 2   loperamide  (IMODIUM ) 2 MG capsule Take 1 capsule (2 mg total) by mouth 4 (four) times daily as needed for diarrhea or loose stools. 12 capsule 0   metFORMIN  (GLUCOPHAGE ) 1000 MG tablet Take 1,000 mg by mouth 2 (two) times daily.     mirtazapine (REMERON) 15 MG tablet Take 15 mg by mouth at bedtime.     OZEMPIC, 0.25 OR 0.5 MG/DOSE, 2 MG/3ML SOPN Inject into the skin.     Pediatric Multivitamins-Iron (FLINTSTONES PLUS IRON PO) Take 1 tablet by mouth daily.     pravastatin  (PRAVACHOL ) 40 MG tablet Take 40 mg by mouth at bedtime.      QUEtiapine  (SEROQUEL ) 25 MG tablet Take 25 mg by mouth at bedtime.     vitamin B-12 (CYANOCOBALAMIN) 500 MCG tablet Take 500 mcg by mouth daily.     zolpidem  (AMBIEN ) 5 MG tablet Take 1 tablet (5 mg total) by mouth at bedtime as needed for sleep. 10 tablet 0   No current facility-administered  medications on file prior to visit.    Allergies  Allergen Reactions   Lyrica [Pregabalin]     Made her afraid of the dark    Social History   Substance and Sexual Activity  Alcohol Use No    Social History   Tobacco Use  Smoking Status Former   Current packs/day: 1.00   Types: Cigarettes  Smokeless Tobacco Never    Review of Systems  Constitutional: Negative.   HENT: Negative.    Eyes: Negative.   Respiratory: Negative.    Cardiovascular: Negative.   Gastrointestinal: Negative.   Genitourinary: Negative.   Musculoskeletal: Negative.   Skin: Negative.   Neurological: Negative.    Endo/Heme/Allergies: Negative.   Psychiatric/Behavioral: Negative.      Objective   Vitals:   04/01/23 1242  BP: 112/73  Pulse: (!) 110  Resp: 12  Temp: 98.4 F (36.9 C)  SpO2: 93%    Physical Exam Vitals reviewed.  Constitutional:      Appearance: Normal appearance. She is not ill-appearing.  HENT:     Head: Normocephalic and atraumatic.  Cardiovascular:     Rate and Rhythm: Normal rate and regular rhythm.     Heart sounds: Normal heart sounds. No murmur heard.    No friction rub. No gallop.  Pulmonary:     Effort: Pulmonary effort is normal. No respiratory distress.     Breath sounds: Normal breath sounds. No stridor. No wheezing, rhonchi or rales.  Abdominal:     General: Bowel sounds are normal. There is distension.     Palpations: Abdomen is soft. There is no mass.     Tenderness: There is no abdominal tenderness. There is no guarding or rebound.     Hernia: No hernia is present.     Comments: Patient has a rotund abdomen.  No right upper quadrant abdominal pain noted.  Skin:    General: Skin is warm and dry.  Neurological:     Mental Status: She is alert and oriented to person, place, and time.    CT scan images person reviewed ER notes reviewed.  Hepatobiliary tests were only remarkable for elevated lipase at 56 Assessment  Cholelithiasis, currently asymptomatic Abdominal distention of unknown etiology.  In reviewing the CT scanning images personally, she does have an enlarged liver and steatosis was mentioned.  Her liver seems larger along the left lobe of the liver and this could partly explain her abdominal girth.  In addition, she may be having symptoms of IBS.  She does take Ozempic which can cause abdominal distention. Plan  There is no need to remove her gallbladder at the present time.  Will refer her back to Dr. Eartha of GI for further evaluation and treatment.  Follow-up here as needed.

## 2023-05-01 ENCOUNTER — Encounter (INDEPENDENT_AMBULATORY_CARE_PROVIDER_SITE_OTHER): Payer: Self-pay | Admitting: Gastroenterology

## 2023-05-01 ENCOUNTER — Ambulatory Visit (INDEPENDENT_AMBULATORY_CARE_PROVIDER_SITE_OTHER): Payer: Medicare HMO | Admitting: Gastroenterology

## 2023-05-01 VITALS — BP 133/82 | HR 97 | Temp 98.0°F | Ht 61.0 in | Wt 175.0 lb

## 2023-05-01 DIAGNOSIS — D509 Iron deficiency anemia, unspecified: Secondary | ICD-10-CM | POA: Diagnosis not present

## 2023-05-01 DIAGNOSIS — R935 Abnormal findings on diagnostic imaging of other abdominal regions, including retroperitoneum: Secondary | ICD-10-CM | POA: Insufficient documentation

## 2023-05-01 DIAGNOSIS — R932 Abnormal findings on diagnostic imaging of liver and biliary tract: Secondary | ICD-10-CM | POA: Diagnosis not present

## 2023-05-01 DIAGNOSIS — K529 Noninfective gastroenteritis and colitis, unspecified: Secondary | ICD-10-CM | POA: Insufficient documentation

## 2023-05-01 NOTE — Patient Instructions (Signed)
 Schedule MRCP Explained to the patient the etiology and consequences of his current liver disease. Patient was counseled about the benefit of implementing a Mediterranean diet and exercise plan to decrease at least 5% of weight. The patient understood about the importance of lifestyle changes to potentially reverse his liver involvement. Continue daily multivitamin We will schedule repeat CT of the abdomen and pelvis with IV contrast adrenal gland protocol in follow-up appointment

## 2023-05-01 NOTE — Progress Notes (Signed)
 Teresa Sweeney, M.D. Gastroenterology & Hepatology [facility] Gastroenterology [address] [place], [place] [address]  Primary Care Physician: Joana Reamer, DO [address] [place] [address]  I will communicate my assessment and recommendations to the referring MD via EMR.  Problems: Abdominal pain, resolved Iron-deficiency anemia Left adrenal gland lesion  History of Present Illness: Teresa Sweeney is a 68 y.o. female with past medical history of anxiety, depression, diabetes, IDA, hyperlipidemia and hypertension  who presents for follow up of abdominal pain.  The patient was last seen on 05/14/2021. At that time, the patient had her iron stores checked which showed normal iron stores with iron 71, saturation 23%, ferritin 56 and hemoglobin of 12.0.  She was advised to continue Flintstone multivitamin daily indefinitely.  Patient reports that early December she had pain in her mid abdomen, along with diarrhea. No melena or hematochezia. Due to this, the patient went to the ER at [facility] on 02/17/2023 after presenting abdominal pain for 2 to [redacted] weeks along with diarrhea.  Labs checked at that time included CMP which showed mild elevation of ALT 37, AST 29, normal total bilirubin 0.2, alkaline phosphatase 88, lipase was normal 56, CBC with WBC 5.5, hemoglobin 12.7 and platelets 253, urinalysis showed small amount of glucose 50 otherwise normal.  CT of the abdomen and pelvis with IV contrast showed presence of cholelithiasis with a stone in the cystic duct, no evidence of cholecystitis.  There was presence of hepatic steatosis, diverticulosis.  A 1.8 cm nodule was present in the left adrenal gland.  Recommended repeat adrenal washout CT in 1 year. Patient was given loperamide as needed.  Patient reports that a month ago she had resolution of her abdominal pain and diarrhea. The patient denies having any nausea, vomiting, fever,  chills, hematochezia, melena, hematemesis, abdominal distention, jaundice, pruritus . Has gained 3 lb since last appointment.  Still taking Flintstone multivitamins.  She was seen by Dr. Lovell Sheehan, who did not recommend undergoing surgery for asymptomatic cholelithiasis.  Last EGD:01/17/2021  normal esophagus, few erosions in the stomach (pathology was negative for H. pylori), normal duodenum (normal small bowel).   Last Colonoscopy:01/17/2021  6 polyps between 1 to 6 mm in size located in the cecum, ascending, transverse and sigmoid colon which were removed (5 out of 6 polyps were tubular adenomas, one was hyperplastic polyp), diverticulosis. Recommend to have repeat colonoscopy in 3 years.   Past Medical History: Past Medical History:  Diagnosis Date   Anxiety    Depression    Diabetes mellitus without complication (HCC)    Hyperlipemia    Hypertension     Past Surgical History: Past Surgical History:  Procedure Laterality Date   ABDOMINAL HYSTERECTOMY     BIOPSY  01/17/2021   Procedure: BIOPSY;  Surgeon: Dolores Frame, MD;  [facility];  Service: Gastroenterology;;  small, intestine, antral, body(gastric);   COLONOSCOPY WITH PROPOFOL N/A 01/17/2021   Procedure: COLONOSCOPY WITH PROPOFOL;  Surgeon: Dolores Frame, MD;  Location: [facility];  Service: Gastroenterology;  Laterality: N/A;  7:30   ESOPHAGOGASTRODUODENOSCOPY (EGD) WITH PROPOFOL N/A 01/17/2021   Procedure: ESOPHAGOGASTRODUODENOSCOPY (EGD) WITH PROPOFOL;  Surgeon: Dolores Frame, MD;  Location: [facility];  Service: Gastroenterology;  Laterality: N/A;   POLYPECTOMY  01/17/2021   Procedure: POLYPECTOMY;  Surgeon: Dolores Frame, MD;  Location: [facility];  Service: Gastroenterology;;  ascendingx3; cecal ;sigmoid    Family History:  Family History  Problem Relation Age of Onset   Heart attack Mother    Diabetes Sister    Diabetes Other    Hypertension  Other    Cancer Other     Social History: Social History   Tobacco Use  Smoking Status Former   Current packs/day: 1.00   Types: Cigarettes  Smokeless Tobacco Never   Social History   Substance and Sexual Activity  Alcohol Use No   Social History   Substance and Sexual Activity  Drug Use No    Allergies: Allergies  Allergen Reactions   Lyrica [Pregabalin]     Made her afraid of the dark    Medications: Current Outpatient Medications  Medication Sig Dispense Refill   amLODipine (NORVASC) 5 MG tablet Take 5 mg by mouth daily.     aspirin EC 81 MG tablet Take 81 mg by mouth daily.     atenolol (TENORMIN) 25 MG tablet TAKE ONE TABLET BY MOUTH EVERY DAY 90 tablet 2   loperamide (IMODIUM) 2 MG capsule Take 1 capsule (2 mg total) by mouth 4 (four) times daily as needed for diarrhea or loose stools. 12 capsule 0   metFORMIN (GLUCOPHAGE) 1000 MG tablet Take 1,000 mg by mouth 2 (two) times daily.     mirtazapine (REMERON) 15 MG tablet Take 15 mg by mouth at bedtime.     OZEMPIC, 0.25 OR 0.5 MG/DOSE, 2 MG/3ML SOPN Inject into the skin once a week.     Pediatric Multivitamins-Iron (FLINTSTONES PLUS IRON PO) Take 1 tablet by mouth daily.     pravastatin (PRAVACHOL) 40 MG tablet Take 40 mg by mouth at bedtime.      QUEtiapine (SEROQUEL) 25 MG tablet Take 25 mg by mouth at bedtime.     vitamin B-12 (CYANOCOBALAMIN) 500 MCG tablet Take 500 mcg by mouth daily.     zolpidem (AMBIEN) 5 MG tablet Take 1 tablet (5 mg total) by mouth at bedtime as needed for sleep. 10 tablet 0   No current facility-administered medications for this visit.    Review of Systems: GENERAL: negative for malaise, night sweats HEENT: No changes in hearing or vision, no nose bleeds or other nasal problems. NECK: Negative for lumps, goiter, pain and significant neck swelling RESPIRATORY: Negative for cough, wheezing CARDIOVASCULAR: Negative for chest pain, leg swelling, palpitations, orthopnea GI: SEE  HPI MUSCULOSKELETAL: Negative for joint pain or swelling, back pain, and muscle pain. SKIN: Negative for lesions, rash PSYCH: Negative for sleep disturbance, mood disorder and recent psychosocial stressors. HEMATOLOGY Negative for prolonged bleeding, bruising easily, and swollen nodes. ENDOCRINE: Negative for cold or heat intolerance, polyuria, polydipsia and goiter. NEURO: negative for tremor, gait imbalance, syncope and seizures. The remainder of the review of systems is noncontributory.   Physical Exam: BP 133/82 (BP Location: Left Arm, Patient Position: Sitting, Cuff Size: Large)   Pulse 97   Temp 98 F (36.7 C) (Oral)   Ht 5\' 1"  (1.549 m)   Wt 175 lb (79.4 kg)   BMI 33.07 kg/m  GENERAL: The patient is AO x3, in no acute distress. HEENT: Head is normocephalic and atraumatic. EOMI are intact. Mouth is well hydrated and without lesions. NECK: Supple. No masses LUNGS: Clear to auscultation. No presence of rhonchi/wheezing/rales. Adequate chest expansion HEART: RRR, normal s1 and s2. ABDOMEN: Soft, nontender, no guarding, no peritoneal signs, and nondistended. BS +. No masses. EXTREMITIES: Without any cyanosis, clubbing, rash, lesions or edema. NEUROLOGIC: AOx3, no focal motor deficit. SKIN: no jaundice,  no rashes  Imaging/Labs: as above  I personally reviewed and interpreted the available labs, imaging and endoscopic files.  Impression and Plan: Teresa Sweeney is a 68 y.o. female with past medical history of anxiety, depression, diabetes, IDA, hyperlipidemia and hypertension  who presents for follow up of abdominal pain.  Patient had a transient episode of abdominal pain and diarrhea which resolved on its own.  She denies having any complaints at the moment.  It is very possible this was related to a transient acute gastroenteritis that has resolved.  No further evaluation is warranted at this point.  During the evaluation of her previous symptoms, the patient was found  to have fatty liver with very mild elevation of her aminotransferases.  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  Also, the patient was found to have a stone in her cystic duct on most recent CT scan.  Will need to evaluate if this stone has passed or is retaining the biliary tract with an MRCP, as this will require further management with endoscopic retrograde cholangiopancreatography if still present.  Her iron deficiency anemia has been well-controlled with multivitamin supplementation which she should continue for now.  Finally, the patient had presence of an adrenal lesion on most recent CT scan.  We will order a repeat CT of the abdomen with adrenal gland protocol in follow-up appointment.  -Schedule MRCP -Explained to the patient the etiology and consequences of current liver disease. Patient was counseled about the benefit of implementing a Mediterranean diet and exercise plan to decrease at least 5% of weight. The patient understood about the importance of lifestyle changes to potentially reverse his liver involvement. -Continue daily multivitamin -Repeat adrenal CT scan in December 2025, to be ordered in next appointment  All questions were answered.      Teresa Blazing, MD Gastroenterology and Hepatology [facility] Gastroenterology

## 2023-05-06 ENCOUNTER — Telehealth (INDEPENDENT_AMBULATORY_CARE_PROVIDER_SITE_OTHER): Payer: Self-pay | Admitting: Gastroenterology

## 2023-05-06 NOTE — Telephone Encounter (Signed)
 MRCP scheduled for 05/10/23 at 10:15am. Pt to arrive at 10 am. NPO 4 hours prior. Pt contacted and made aware.  Authorized via [employer] # [id number] Valid Dates-05/06/23-07/05/23

## 2023-05-10 ENCOUNTER — Other Ambulatory Visit (INDEPENDENT_AMBULATORY_CARE_PROVIDER_SITE_OTHER): Payer: Self-pay | Admitting: Gastroenterology

## 2023-05-10 ENCOUNTER — Ambulatory Visit (HOSPITAL_COMMUNITY)
Admission: RE | Admit: 2023-05-10 | Discharge: 2023-05-10 | Disposition: A | Discharge status: Home / Self Care | Source: Ambulatory Visit | Attending: Gastroenterology | Admitting: Gastroenterology

## 2023-05-10 DIAGNOSIS — R935 Abnormal findings on diagnostic imaging of other abdominal regions, including retroperitoneum: Secondary | ICD-10-CM | POA: Insufficient documentation

## 2023-05-10 DIAGNOSIS — R932 Abnormal findings on diagnostic imaging of liver and biliary tract: Secondary | ICD-10-CM

## 2023-05-10 MED ORDER — GADOBUTROL 1 MMOL/ML IV SOLN
7.5000 mL | Freq: Once | INTRAVENOUS | Status: AC | PRN
  Administered 2023-05-10: 7.5 mL via INTRAVENOUS

## 2023-09-06 IMAGING — CT CT HEAD W/O CM
3 series · 16 of 47 positions shown, 19 images · non-contrast
Comparison: April 11, 2016.

CLINICAL DATA: Headache.

EXAM:
CT HEAD WITHOUT CONTRAST
TECHNIQUE: Contiguous axial images were obtained from the base of the skull
through the vertex without intravenous contrast.

[Series 2: head w o · axial · 0.42mm/px · z∈[+56,+181]mm · 10 of 30 slices shown, 13 images]
[im 3/30  brain]
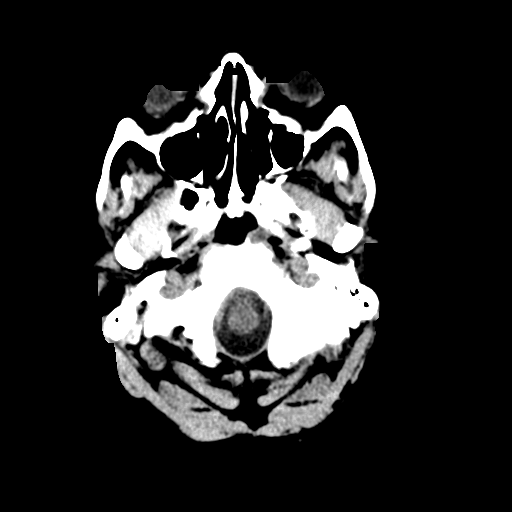
[im 3/30  bone]
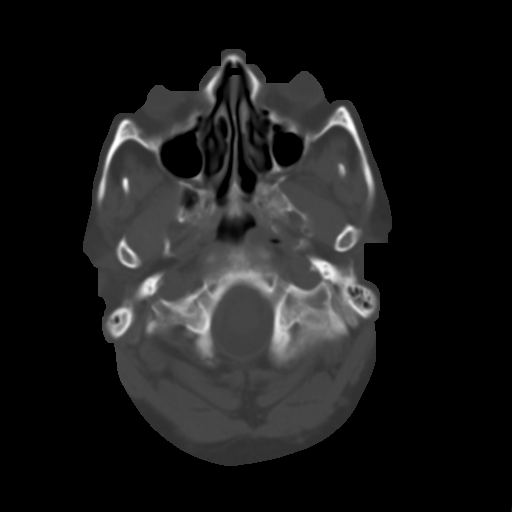
[im 6/30  brain]
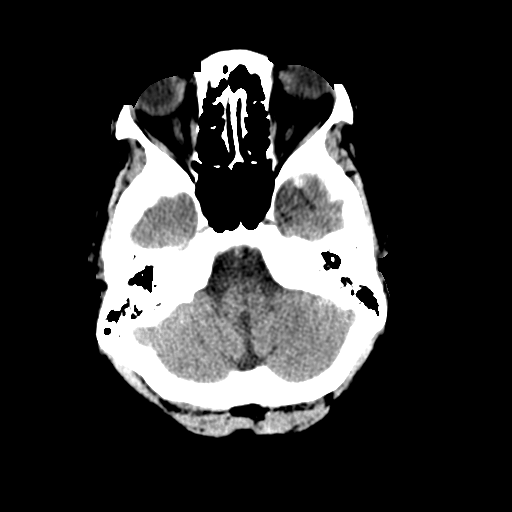
[im 9/30  brain]
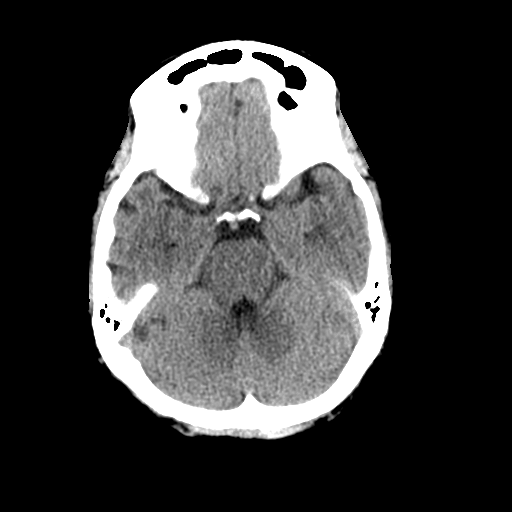
[im 11/30  brain]
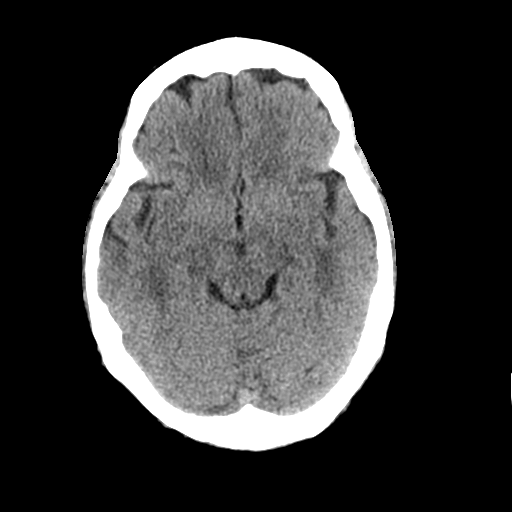
[im 14/30  brain]
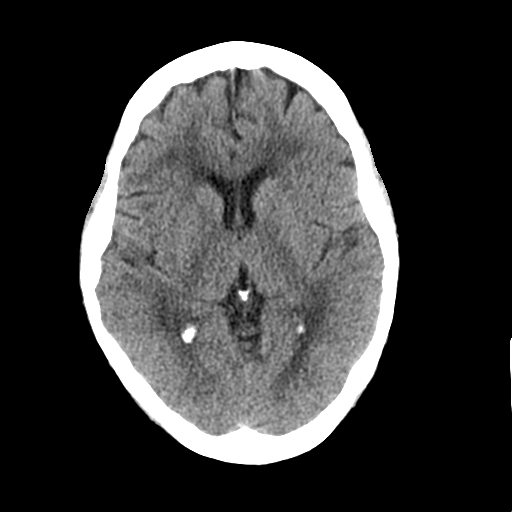
[im 14/30  bone]
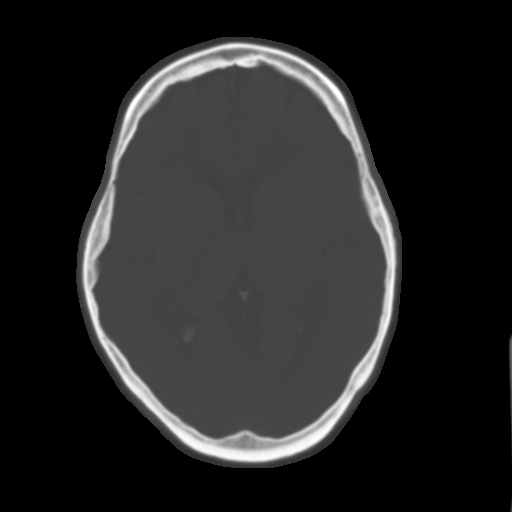
[im 17/30  brain]
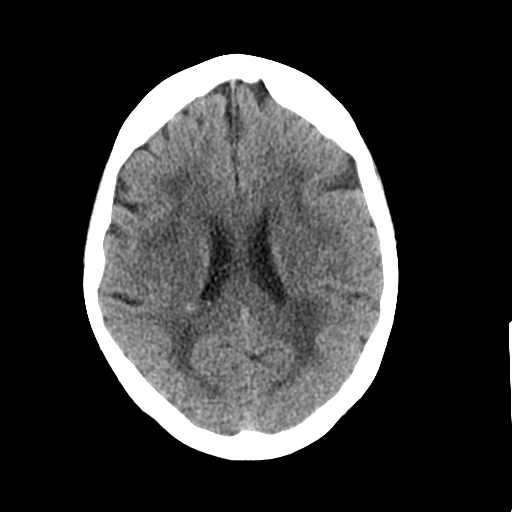
[im 20/30  brain]
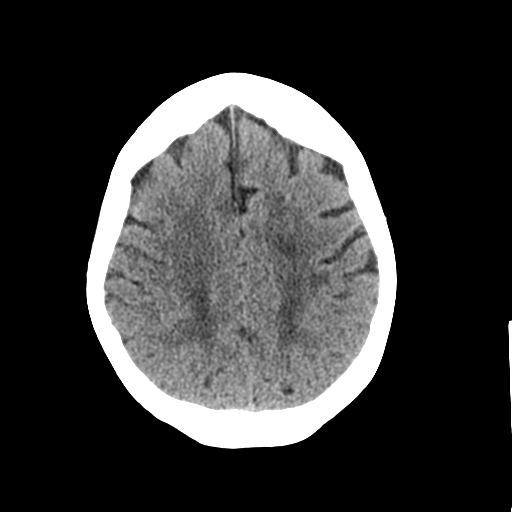
[im 23/30  brain]
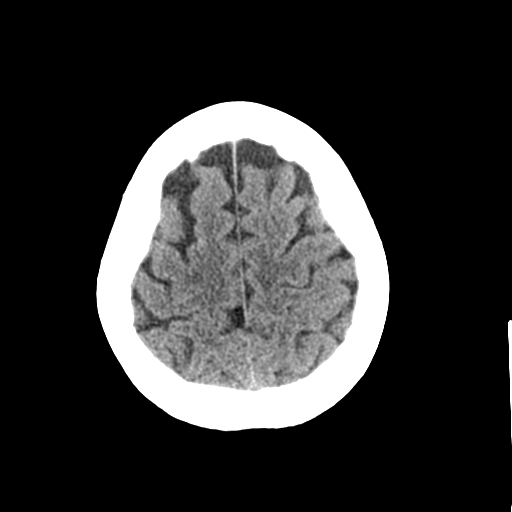
[im 25/30  brain]
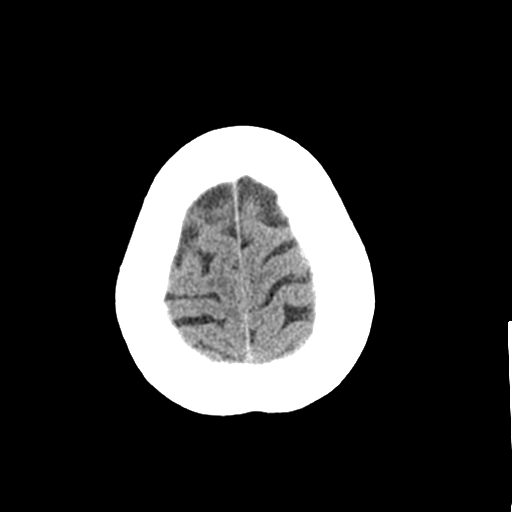
[im 25/30  bone]
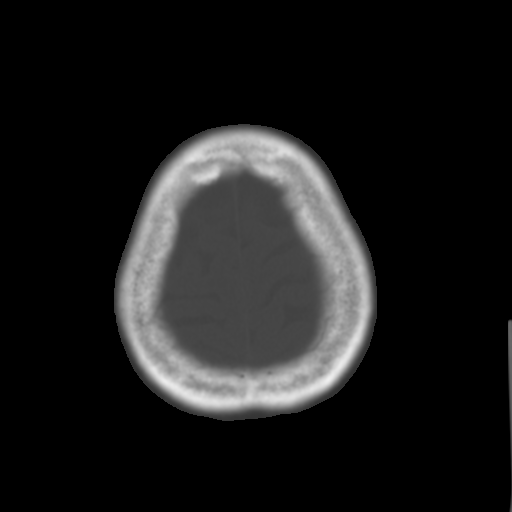
[im 28/30  brain]
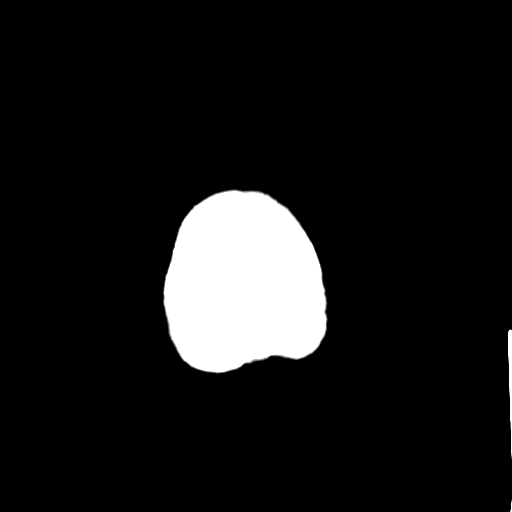

[Series 4: coronal soft · coronal · 0.31mm/px · 3 of 65 slices shown]
[im 22/65  brain]
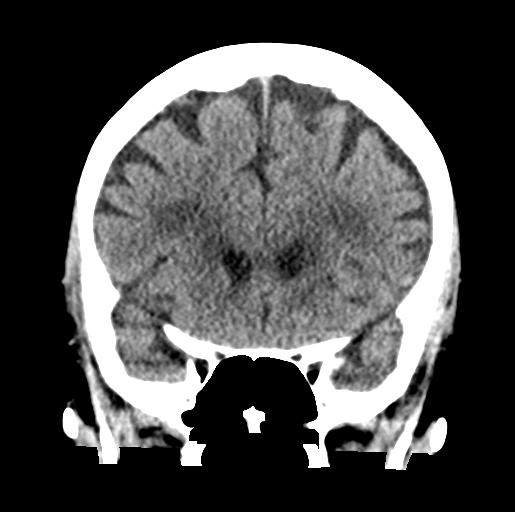
[im 29/65  brain]
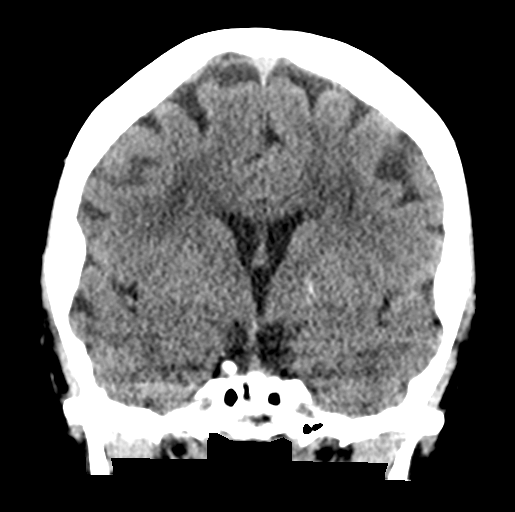
[im 36/65  brain]
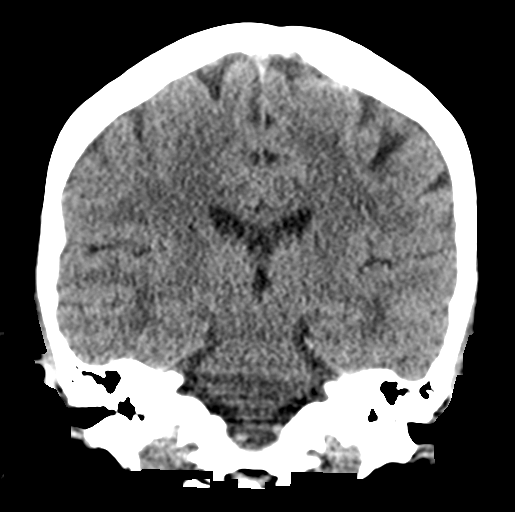

[Series 5: sagittal soft · sagittal · 0.34mm/px · 3 of 54 slices shown]
[im 18/54  brain]
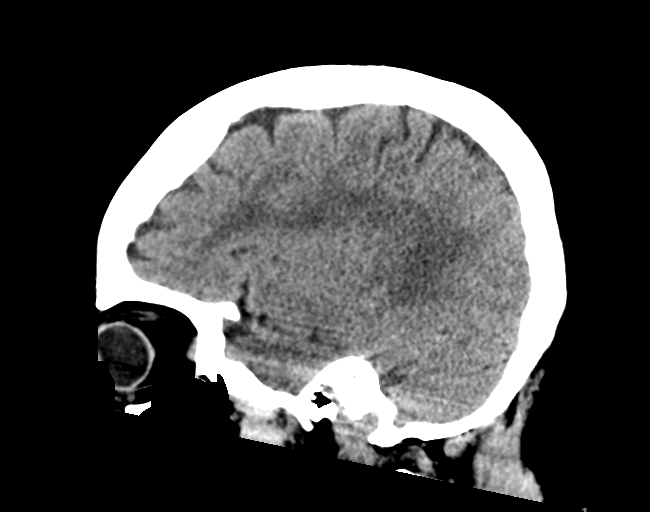
[im 27/54  brain]
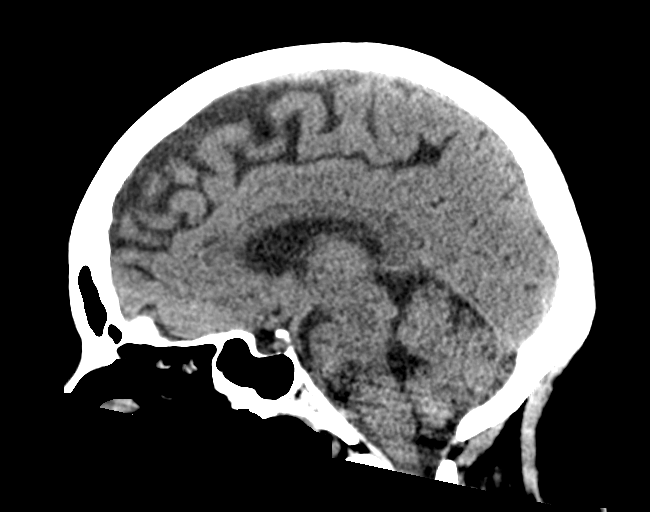
[im 36/54  brain]
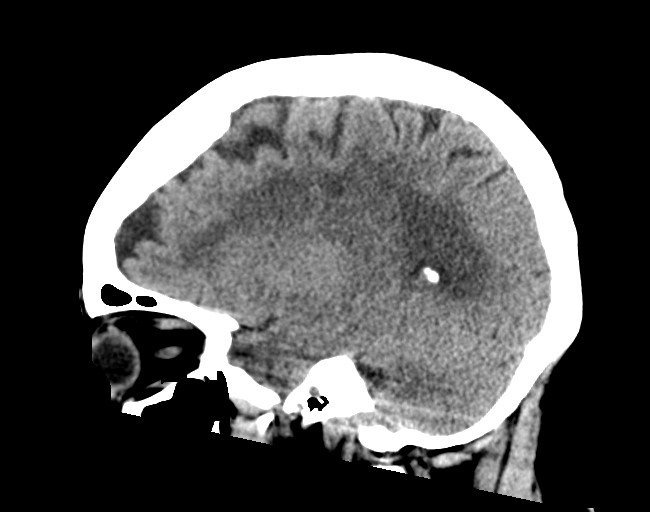

[16 of 47 positions shown; findings below may reference images not displayed]

FINDINGS: Brain: Mild chronic ischemic white matter disease is noted. No mass
effect or midline shift is noted. Ventricular size is within normal
limits. There is no evidence of mass lesion, hemorrhage or acute
infarction.

Vascular: No hyperdense vessel or unexpected calcification.

Skull: Normal. Negative for fracture or focal lesion.

Sinuses/Orbits: No acute finding.

Other: None.
IMPRESSION: No acute intracranial abnormality seen.

## 2023-10-22 ENCOUNTER — Encounter (INDEPENDENT_AMBULATORY_CARE_PROVIDER_SITE_OTHER): Payer: Self-pay | Admitting: Gastroenterology

## 2023-10-23 ENCOUNTER — Encounter (INDEPENDENT_AMBULATORY_CARE_PROVIDER_SITE_OTHER): Payer: Self-pay | Admitting: Gastroenterology

## 2023-12-11 ENCOUNTER — Encounter (INDEPENDENT_AMBULATORY_CARE_PROVIDER_SITE_OTHER): Payer: Self-pay | Admitting: *Deleted

## 2023-12-31 ENCOUNTER — Encounter (INDEPENDENT_AMBULATORY_CARE_PROVIDER_SITE_OTHER): Payer: Self-pay | Admitting: Gastroenterology
